# Patient Record
Sex: Male | Born: 1970 | Race: White | Hispanic: No | Marital: Married | Smoking: Never smoker
Health system: Southern US, Community
[De-identification: ages and names within clinical notes are randomized; demographics above are authoritative.]

## PROBLEM LIST (undated history)

## (undated) DIAGNOSIS — E785 Hyperlipidemia, unspecified: Secondary | ICD-10-CM

## (undated) DIAGNOSIS — Z9889 Other specified postprocedural states: Secondary | ICD-10-CM

## (undated) DIAGNOSIS — J189 Pneumonia, unspecified organism: Secondary | ICD-10-CM

## (undated) DIAGNOSIS — K219 Gastro-esophageal reflux disease without esophagitis: Secondary | ICD-10-CM

## (undated) DIAGNOSIS — Z87442 Personal history of urinary calculi: Secondary | ICD-10-CM

## (undated) DIAGNOSIS — R112 Nausea with vomiting, unspecified: Principal | ICD-10-CM

## (undated) DIAGNOSIS — I10 Essential (primary) hypertension: Secondary | ICD-10-CM

## (undated) DIAGNOSIS — R519 Headache, unspecified: Secondary | ICD-10-CM

## (undated) HISTORY — PX: HERNIA REPAIR: SHX51

## (undated) HISTORY — DX: Pneumonia, unspecified organism: J18.9

## (undated) HISTORY — DX: Hyperlipidemia, unspecified: E78.5

---

## 2011-11-12 ENCOUNTER — Ambulatory Visit (INDEPENDENT_AMBULATORY_CARE_PROVIDER_SITE_OTHER): Payer: 59 | Admitting: Urology

## 2011-11-12 DIAGNOSIS — Z3009 Encounter for other general counseling and advice on contraception: Secondary | ICD-10-CM

## 2011-12-31 ENCOUNTER — Encounter (INDEPENDENT_AMBULATORY_CARE_PROVIDER_SITE_OTHER): Payer: 59 | Admitting: Urology

## 2011-12-31 DIAGNOSIS — Z302 Encounter for sterilization: Secondary | ICD-10-CM

## 2012-01-14 ENCOUNTER — Ambulatory Visit (INDEPENDENT_AMBULATORY_CARE_PROVIDER_SITE_OTHER): Payer: 59 | Admitting: Urology

## 2012-01-14 DIAGNOSIS — Z09 Encounter for follow-up examination after completed treatment for conditions other than malignant neoplasm: Secondary | ICD-10-CM

## 2020-04-28 DIAGNOSIS — J189 Pneumonia, unspecified organism: Secondary | ICD-10-CM

## 2020-04-28 HISTORY — DX: Pneumonia, unspecified organism: J18.9

## 2020-07-24 ENCOUNTER — Ambulatory Visit (INDEPENDENT_AMBULATORY_CARE_PROVIDER_SITE_OTHER): Payer: BC Managed Care – PPO | Admitting: Internal Medicine

## 2020-07-24 ENCOUNTER — Other Ambulatory Visit: Payer: Self-pay

## 2020-07-24 ENCOUNTER — Ambulatory Visit (HOSPITAL_COMMUNITY)
Admission: RE | Admit: 2020-07-24 | Discharge: 2020-07-24 | Disposition: A | Discharge status: Home / Self Care | Payer: BC Managed Care – PPO | Source: Ambulatory Visit | Attending: Internal Medicine | Admitting: Internal Medicine

## 2020-07-24 ENCOUNTER — Encounter: Payer: Self-pay | Admitting: Internal Medicine

## 2020-07-24 DIAGNOSIS — J479 Bronchiectasis, uncomplicated: Secondary | ICD-10-CM | POA: Insufficient documentation

## 2020-07-24 DIAGNOSIS — J9611 Chronic respiratory failure with hypoxia: Secondary | ICD-10-CM | POA: Diagnosis not present

## 2020-07-24 DIAGNOSIS — R0609 Other forms of dyspnea: Secondary | ICD-10-CM

## 2020-07-24 DIAGNOSIS — R0600 Dyspnea, unspecified: Secondary | ICD-10-CM | POA: Diagnosis not present

## 2020-07-24 DIAGNOSIS — I10 Essential (primary) hypertension: Secondary | ICD-10-CM | POA: Insufficient documentation

## 2020-07-24 NOTE — Assessment & Plan Note (Signed)
Onset was Apr 28 2020 with covid  -  07/24/2020   Walked RA  approx   600 ft  @ fast pace  stopped due to  End of study s sob and sats 96%   At this point the main problem appears to be deconditioning > rec sub max ex x 30 min bid x 2 weeks then return for   RTW eval

## 2020-07-24 NOTE — Assessment & Plan Note (Signed)
Noted at pulmonary ov 07/24/2020   Not optimally controlled on present regimen. I reviewed this with the patient and emphasized importance of follow-up with primary care.

## 2020-07-24 NOTE — Progress Notes (Signed)
Raymond Watson, male    DOB: 08-31-1970  MRN: [id number]   Brief patient profile:  49 yo male never smoker/ never vaccinated  with h/o gerd and hyperlipidemia then acutely ill around Apr 28 2020   [facility] Internal Medicine Discharge Summary    Admit date: 04/29/2020 Discharge date: 05/10/2020 Length of stay: LOS: 11 days   Discharge Service: [facility] Internal Medicine Discharge Attending Physician: Kirstie Peri Discharge to: To Home Condition at Discharge: fair Code Status: Full Code  Patient Care Team: Hortencia Pilar, MD as PCP - General (Internal Medicine)  Consults   none  Discharge Diagnoses  Principal Problem: Pneumonia due to COVID-19 virus Active Problems: Hypoxia Diarrhea Hyperlipemia AKI (acute kidney injury) (CMS-HCC) Hiatal hernia Acute respiratory failure with hypoxia (CMS-HCC)    Hospital Course  Hello hello 49 year old gentleman 37 with history of hypertension comes with shortness of breath noted to have pneumonia associated with COVID-19 virus patient was admitted to hospital treated with antibiotics remdesivir and anticoagulation and followed closely management he slowly started to improve his oxygen level was better and lately at 4 L nasal cannula he was stable activity was increased and was discharged on medications noted below with a plan to follow him up in 1 week.  I spent greater than 30 mins in the discharge of this patient.  Procedures   none  Discharge Medications    Your Medication List   STOP taking these medications  azithromycin 250 MG tablet Commonly known as: ZITHROMAX Z-PAK  brompheniramine-pseudoephedrine-DM 2-30-10 mg/5 mL syrup Commonly known as: BROMFED DM   START taking these medications  albuterol 90 mcg/actuation inhaler Commonly known as: PROVENTIL HFA;VENTOLIN HFA Inhale 2 puffs every six (6) hours as needed for wheezing.   CONTINUE taking these medications  predniSONE 5 MG tablet Commonly known as: DELTASONE Take  6-5-4-3-2-1 po qd  rosuvastatin 5 MG tablet Commonly known as: CRESTOR Take 5 mg by mouth daily.    Pending Test Results   none  Lab Results   BLOOD No results in the last week Recent Labs  Lab Units 05/08/20 0412 05/07/20 0413  SODIUM mmol/L 135 136  POTASSIUM mmol/L 4.2 4.2  CHLORIDE mmol/L 101 103  CO2 mmol/L 29.3 28.2  BUN mg/dL 27* 29*  CREATININE mg/dL 6.27* 0.35*  GLUCOSE mg/dL 96 99  CALCIUM mg/dL 7.9* 8.2*   No results in the last week No results in the last week No results in the last week  No results in the last week URINE No results in the last week No results in the last week BODY FLUIDS No results in the last week ABG No results for input(s): O2SOUR, FIO2ART, PHART, PCO2ART, PO2ART, HCO3ART, O2SATART, BEART in the last 72 hours.  Microbiology Results (last day)  ** No results found for the last 24 hours. **    Imaging   ECG 12 Lead  Result Date: 04/30/2020 Normal sinus rhythm Abnormal QRS-T angle, consider primary T wave abnormality Abnormal ECG When compared with ECG of 28-Apr-2020 23:24, Vent. rate has decreased BY 58 BPM Confirmed by Gwenyth Bouillon ([id number]) on 04/30/2020 4:18:53 AM  XR Chest Portable  Result Date: 04/29/2020 CLINICAL DATA: Headache, body aches and cough which began Tuesday, COVID-19 positive EXAM: PORTABLE CHEST 1 VIEW COMPARISON: None. FINDINGS: Patchy heterogeneous opacities are present in both lungs with low volumes and streaky areas of more atelectatic change. No pneumothorax or visible effusion. There is a large air-filled hiatal hernia versus left diaphragmatic eventration. Remaining cardiomediastinal contours are unremarkable.  Gaseous distension of the colonic segments in the upper abdomen is nonspecific. No other acute or suspicious osseous or soft tissue abnormality. Mild degenerative changes in the shoulders.   1. Patchy heterogeneous opacities in both lungs consistent with pneumonia in the setting of COVID-19  positivity. 2. Large air-filled hiatal hernia versus left diaphragmatic eventration. 3. Air-filled loops of colon in the upper abdomen, nonspecific, correlate for abdominal symptoms. Electronically Signed By: Kreg ShropshirePrice DeHay M.D. On: 04/29/2020 02:09   CTA Chest W Contrast  Result Date: 04/30/2020 CLINICAL DATA: Shortness of breath. Concern for pulmonary embolism. COVID positive. EXAM: CT ANGIOGRAPHY CHEST WITH CONTRAST TECHNIQUE: Multidetector CT imaging of the chest was performed using the standard protocol during bolus administration of intravenous contrast. Multiplanar CT image reconstructions and MIPs were obtained to evaluate the vascular anatomy. CONTRAST: 100 cc of Isovue 370. COMPARISON: None. FINDINGS: Cardiovascular: Evaluation is limited by respiratory motion artifact. Given this significant limitation, no definite acute pulmonary embolism was detected. The heart size is unremarkable. There is no significant pericardial effusion. No evidence for thoracic aortic aneurysm or dissection. Mediastinum/Nodes: -- No mediastinal lymphadenopathy. -- No hilar lymphadenopathy. -- No axillary lymphadenopathy. -- No supraclavicular lymphadenopathy. -- Normal thyroid gland where visualized. -there is a large hiatal hernia with evidence for an organoaxial gastric volvulus. There is no definite evidence for obstruction. Lungs/Pleura: There are diffuse bilateral hazy airspace opacities consistent with the patient's history of viral pneumonia. There is significant compressive atelectasis involving the left lower lobe which is nearly entirely collapsed. There is a moderate amount of atelectasis within the lingula. There is no pneumothorax. The trachea is unremarkable. Upper Abdomen: Contrast bolus timing is not optimized for evaluation of the abdominal organs. The visualized portions of the organs of the upper abdomen are normal. Musculoskeletal: No chest wall abnormality. No bony spinal canal stenosis. Review of the MIP  images confirms the above findings. .   1. Evaluation is limited by respiratory motion artifact. Given this significant limitation, no definite acute pulmonary embolism was detected. 2. Diffuse bilateral hazy airspace opacities consistent with the patient's history of viral pneumonia. 3. Large hiatal hernia with the majority of the patient's stomach herniated into the thoracic cavity. The orientation of the stomach is consistent with an organoaxial gastric volvulus without evidence for obstruction. There is no definite evidence for obstruction. Electronically Signed By: Katherine Mantlehristopher Green M.D. On: 04/30/2020 20:09   Discharge Instructions   Resources and Referrals  Oxygen (DME)  Oxygen Type: Continuous  % saturation on room air at rest: 88  Provide Continous Oxygen at (LPM): 4  Oxygen device delivery method: Nasal Cannula  Type of portable O2 tank and concentrator to deliver: Gaseous  Other Orders: N/A  Length of Need: 99  Room Air Testing by pulse oximetry or ABG (Arterial Blood Gas): 88 % saturation rate on room air at rest. Please provide continuous oxygen at 4LPM via nasal cannula. Please deliver portable O2 tanks and concentrator to patient.   Room Air Testing by pulse oximetry or ABG (Arterial Blood Gas): 88 % saturation rate on room air at rest. Please provide continuous oxygen at 4LPM via nasal cannula. Please deliver portable O2 tanks and concentrator to patient.         History of Present Illness  07/24/2020  Pulmonary/ 1st office eval/ Jaylianna Tatlock / [place] [facility]  Chief Complaint  Patient presents with   Consult    No complaints currently, currently on 2L O2  Dyspnea:  Mailbox is downhill from house and can back up  hill to house s stopping at slower than usual pace but sats so far above 90% s  02  Cough: much better  Sleep: sleeps on pillows at about 30 degrees  SABA use: symbicort 160 2bid / twice albuterol  02 2lpm concentrator at hs and 24/7.  No obvious day to day  or daytime variability or assoc excess/ purulent sputum or mucus plugs or hemoptysis or cp or chest tightness, subjective wheeze or overt sinus or hb symptoms (as long as takes dexilant)   Sleeping as above without nocturnal  or early am exacerbation  of respiratory  c/o's or need for noct saba. Also denies any obvious fluctuation of symptoms with weather or environmental changes or other aggravating or alleviating factors except as outlined above   No unusual exposure hx or h/o childhood pna/ asthma or knowledge of premature birth.  Current Allergies, Complete Past Medical History, Past Surgical History, Family History, and Social History were reviewed in [employer] record.  ROS  The following are not active complaints unless bolded Hoarseness, sore throat, dysphagia, dental problems, itching, sneezing,  nasal congestion or discharge of excess mucus or purulent secretions, ear ache,   fever, chills, sweats, unintended wt loss or wt gain, classically pleuritic or exertional cp,  orthopnea pnd or arm/hand swelling  or leg swelling, presyncope, palpitations, abdominal pain, anorexia, nausea, vomiting, diarrhea  or change in bowel habits or change in bladder habits, change in stools or change in urine, dysuria, hematuria,  rash, arthralgias, visual complaints, headache, numbness, weakness or ataxia or problems with walking or coordination,  change in mood or  memory.           Past Medical History:  Diagnosis Date   Hyperlipidemia    Pneumonia     Outpatient Medications Prior to Visit  Medication Sig Dispense Refill   albuterol (VENTOLIN HFA) 108 (90 Base) MCG/ACT inhaler Inhale 2 puffs into the lungs every 6 (six) hours.     dexlansoprazole (DEXILANT) 60 MG capsule Take 60 mg by mouth daily.     rosuvastatin (CRESTOR) 5 MG tablet Take 5 mg by mouth daily.     SYMBICORT 160-4.5 MCG/ACT inhaler Inhale 2 puffs into the lungs in the morning and at bedtime.     No  facility-administered medications prior to visit.     Objective:     BP (!) 154/98 (BP Location: Left Arm, Cuff Size: Normal)    Pulse 88    Temp (!) 97.2 F (36.2 C) (Other (Comment)) Comment (Src): wrist   Ht 5\' 5"  (1.651 m)    Wt 171 lb 6.4 oz (77.7 kg)    SpO2 100% Comment: 2L O2   BMI 28.52 kg/m   SpO2: 100 % (2L O2) O2 Type: Continuous O2 O2 Flow Rate (L/min): 2 L/min   Sats RA rest =    HEENT : pt wearing mask not removed for exam due to covid -19 concerns.    NECK :  without JVD/Nodes/TM/ nl carotid upstrokes bilaterally   LUNGS: no acc muscle use,  Nl contour chest which is clear to A and P bilaterally without cough on insp or exp maneuvers   CV:  RRR  no s3 or murmur or increase in P2, and no edema   ABD:  soft and nontender with nl inspiratory excursion in the supine position. No bruits or organomegaly appreciated, bowel sounds nl  MS:  Nl gait/ ext warm without deformities, calf tenderness, cyanosis or clubbing No obvious joint  restrictions   SKIN: warm and dry without lesions    NEURO:  alert, approp, nl sensorium with  no motor or cerebellar deficits apparent.    CXR PA and Lateral:   07/24/2020 :    I personally reviewed images and agree with radiology impression as follows:    1. Improving appearance of the chest without signs of consolidation or effusion.     2. Large hiatal hernia.   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    Chronic respiratory failure with hypoxia [facility]) Onset Oct 2021 with covid -   07/24/2020   Walked RA  approx   600 ft  @ fast pace  stopped due to  End of study s sob and sats 96%   Advised just to use at hs for now and prn daytime  = Make sure you check your oxygen saturation  at your highest level of activity  to be sure it stays over 90% and adjust  02 flow upward to maintain this level if needed but remember to turn it back to previous settings when you stop (to conserve your supply).     White coat syndrome with hypertension Noted at pulmonary ov 07/24/2020   Not optimally controlled on present regimen. I reviewed this with the patient and emphasized importance of follow-up with primary care.             Each maintenance medication was reviewed in detail including emphasizing most importantly the difference between maintenance and prns and under what circumstances the prns are to be triggered using an action plan format where appropriate.  Total time for H and P, chart review, counseling, teaching device  directly observing portions of ambulatory 02 saturation study/  and generating customized AVS unique to this office visit / charting = 52 min        Sandrea Hughs, MD 07/24/2020

## 2020-07-24 NOTE — Assessment & Plan Note (Signed)
CT chest 06/09/20 p covid changes with tubular bronchiectasis both lower lobes -  Repeat CT = HRCT rec for 12/07/20 (placed in reminder file )  - 07/24/2020   Demonstrated approp hfa > use symbicort 160 1-2 bid and approp saba   Does not have the typical bronchiectatic cough at this point but may benefit from symb low dose = 160 1-2 bid (one bid if doing well, then change to 80 next ov p confirm adequate hfa technique.

## 2020-07-24 NOTE — Assessment & Plan Note (Addendum)
Onset Oct 2021 with covid -   07/24/2020   Walked RA  approx   600 ft  @ fast pace  stopped due to  End of study s sob and sats 96%   Advised just to use at hs for now and prn daytime  = Make sure you check your oxygen saturation  at your highest level of activity  to be sure it stays over 90% and adjust  02 flow upward to maintain this level if needed but remember to turn it back to previous settings when you stop (to conserve your supply).           Each maintenance medication was reviewed in detail including emphasizing most importantly the difference between maintenance and prns and under what circumstances the prns are to be triggered using an action plan format where appropriate.  Total time for H and P, chart review, counseling, teaching device  directly observing portions of ambulatory 02 saturation study/  and generating customized AVS unique to this office visit / charting = 52 min

## 2020-07-24 NOTE — Patient Instructions (Addendum)
Plan A = Automatic = Always=    Symbicort 160 Take 1- 2 puffs first thing in am and then another 1-2 puffs about 12 hours later.   Work on inhaler technique:  relax and gently blow all the way out then take a nice smooth deep breath back in, triggering the inhaler at same time you start breathing in.  Hold for up to 5 seconds if you can. Blow out thru nose. Rinse and gargle with water when done    Plan B = Backup (to supplement plan A, not to replace it) Only use your albuterol inhaler as a rescue medication to be used if you can't catch your breath by resting or doing a relaxed purse lip breathing pattern.  - The less you use it, the better it will work when you need it. - Ok to use the inhaler up to 2 puffs  every 4 hours if you must but call for appointment if use goes up over your usual need - Don't leave home without it !!  (think of it like the spare tire for your car)    Ok to try albuterol (on alternate days) 15 min before an activity that you know would make you short of breath and see if it makes any difference and if makes none then don't take it after activity unless you can't catch your breath.   Make sure you check your oxygen saturation  at your highest level of activity  to be sure it stays over 90% and adjust  02 flow upward to maintain this level if needed but remember to turn it back to previous settings when you stop (to conserve your supply).    Continue 2lpm at bedtime and the rest of the day only if needed     To get the most out of exercise, you need to be continuously aware that you are short of breath, but never out of breath, for 30 minutes twice daily. As you improve, it will actually be easier for you to do the same amount of exercise  in  30 minutes so always push to the level where you are short of breath.     Please remember to go to the  x-ray department  @  [facility] for your tests - we will call you with the results when they are available       Please schedule a follow up office visit in 2  weeks, sooner if needed

## 2020-08-01 ENCOUNTER — Telehealth: Payer: Self-pay | Admitting: Internal Medicine

## 2020-08-01 NOTE — Telephone Encounter (Signed)
Routing to [place] for follow-up

## 2020-08-02 DIAGNOSIS — J479 Bronchiectasis, uncomplicated: Secondary | ICD-10-CM

## 2020-08-02 NOTE — Telephone Encounter (Signed)
Forms will be given to Dr. Sherene Sires tomorrow 1/6 - as he is gone for today - sent email to Marisue Ivan to drop charge. -pr

## 2020-08-07 NOTE — Telephone Encounter (Signed)
Faxed forms to [place] at [phone]

## 2020-08-07 NOTE — Telephone Encounter (Signed)
Patient will pay $29 when he comes in tomorrow for office visit -pr

## 2020-08-08 ENCOUNTER — Other Ambulatory Visit: Payer: Self-pay

## 2020-08-08 ENCOUNTER — Encounter: Payer: Self-pay | Admitting: *Deleted

## 2020-08-08 ENCOUNTER — Ambulatory Visit (INDEPENDENT_AMBULATORY_CARE_PROVIDER_SITE_OTHER): Payer: BC Managed Care – PPO | Admitting: Internal Medicine

## 2020-08-08 ENCOUNTER — Other Ambulatory Visit (HOSPITAL_COMMUNITY)
Admission: RE | Admit: 2020-08-08 | Discharge: 2020-08-08 | Disposition: A | Discharge status: Home / Self Care | Payer: BC Managed Care – PPO | Source: Ambulatory Visit | Attending: Internal Medicine | Admitting: Internal Medicine

## 2020-08-08 ENCOUNTER — Encounter: Payer: Self-pay | Admitting: Internal Medicine

## 2020-08-08 DIAGNOSIS — R0600 Dyspnea, unspecified: Secondary | ICD-10-CM | POA: Diagnosis not present

## 2020-08-08 DIAGNOSIS — R0609 Other forms of dyspnea: Secondary | ICD-10-CM

## 2020-08-08 DIAGNOSIS — J479 Bronchiectasis, uncomplicated: Secondary | ICD-10-CM

## 2020-08-08 DIAGNOSIS — J9611 Chronic respiratory failure with hypoxia: Secondary | ICD-10-CM | POA: Diagnosis not present

## 2020-08-08 LAB — CBC WITH DIFFERENTIAL/PLATELET
Abs Immature Granulocytes: 0.01 10*3/uL (ref 0.00–0.07)
Basophils Absolute: 0 10*3/uL (ref 0.0–0.1)
Basophils Relative: 1 %
Eosinophils Absolute: 0.1 10*3/uL (ref 0.0–0.5)
Eosinophils Relative: 1 %
HCT: 44.8 % (ref 39.0–52.0)
Hemoglobin: 13.7 g/dL (ref 13.0–17.0)
Immature Granulocytes: 0 %
Lymphocytes Relative: 31 %
Lymphs Abs: 2.4 10*3/uL (ref 0.7–4.0)
MCH: 27.1 pg (ref 26.0–34.0)
MCHC: 30.6 g/dL (ref 30.0–36.0)
MCV: 88.5 fL (ref 80.0–100.0)
Monocytes Absolute: 0.6 10*3/uL (ref 0.1–1.0)
Monocytes Relative: 8 %
Neutro Abs: 4.6 10*3/uL (ref 1.7–7.7)
Neutrophils Relative %: 59 %
Platelets: 233 10*3/uL (ref 150–400)
RBC: 5.06 MIL/uL (ref 4.22–5.81)
RDW: 15.2 % (ref 11.5–15.5)
WBC: 7.7 10*3/uL (ref 4.0–10.5)
nRBC: 0 % (ref 0.0–0.2)

## 2020-08-08 LAB — BASIC METABOLIC PANEL
Anion gap: 6 (ref 5–15)
BUN: 18 mg/dL (ref 6–20)
CO2: 28 mmol/L (ref 22–32)
Calcium: 8.8 mg/dL — ABNORMAL LOW (ref 8.9–10.3)
Chloride: 105 mmol/L (ref 98–111)
Creatinine, Ser: 1.38 mg/dL — ABNORMAL HIGH (ref 0.61–1.24)
GFR, Estimated: >60 mL/min (ref >60)
Glucose, Bld: 112 mg/dL — ABNORMAL HIGH (ref 70–99)
Potassium: 4.3 mmol/L (ref 3.5–5.1)
Sodium: 139 mmol/L (ref 135–145)

## 2020-08-08 LAB — BRAIN NATRIURETIC PEPTIDE: B Natriuretic Peptide: 15 pg/mL (ref 0.0–100.0)

## 2020-08-08 LAB — SEDIMENTATION RATE: Sed Rate: 8 mm/hr (ref 0–16)

## 2020-08-08 LAB — D-DIMER, QUANTITATIVE: D-Dimer, Quant: <0.27 ug/mL-FEU (ref 0.00–0.50)

## 2020-08-08 NOTE — Assessment & Plan Note (Signed)
Onset was Apr 28 2020 with covid  -  07/24/2020   Walked RA  approx   600 ft  @ fast pace  stopped due to  End of study s sob and sats 96%  Symptoms are   disproportionate to objective findings and not clear to what extent this is   a pulmonary  problem but pt does appear to have difficult to sort out respiratory symptoms of unknown origin for which  DDX  = almost all start with A and  include Adherence, Ace Inhibitors, Acid Reflux, Active Sinus Disease, Alpha 1 Antitripsin deficiency, Anxiety masquerading as Airways dz,  ABPA,  Allergy(esp in young), Aspiration (esp in elderly), Adverse effects of meds,  Active smoking or Vaping, A bunch of PE's/clot burden (a few small clots can't cause this syndrome unless there is already severe underlying pulm or vascular dz with poor reserve),  Anemia or thyroid disorder, plus two Bs  = Bronchiectasis and Beta blocker use..and one C= CHF     Adherence is always the initial "prime suspect" and is a multilayered concern that requires a "trust but verify" approach in every patient - starting with knowing how to use medications, especially inhalers, correctly, keeping up with refills and understanding the fundamental difference between maintenance and prns vs those medications only taken for a very short course and then stopped and not refilled.  - see hfa teaching  ? Acid (or non-acid) GERD > always difficult to exclude as up to 75% of pts in some series report no assoc GI/ Heartburn symptoms and he has a large HH with atypical cp > rec max (24h)  acid suppression and diet restrictions/ reviewed and instructions given in writing.   ? Allergy / asthma > eos nl/ Continue symbicort 160 2bid   ? Adverse drug effects > none of the usual suspects listed  ? Anemia/thyroid dz > hgb ok/ note tsh ok at Dr Margaretmary Eddy office in Nov 2021  ? A bunch of PEs  > D dimer nl - while a normal   value (seen commonly in the elderly or chronically ill)  may miss small peripheral pe, the  clot burden with sob is moderately high and the d dimer  has a very high neg pred value if used in this setting.    ? Bronchiectasis > needs pfts ordered then ?  Return to work ?    ? chf > bnp nl excludes    >>> reconditioning reviewed - see avs for instructions unique to this ov

## 2020-08-08 NOTE — Patient Instructions (Addendum)
Work on inhaler technique:  relax and gently blow all the way out then take a nice smooth deep breath back in, triggering the inhaler at same time you start breathing in.  Hold for up to 5 seconds if you can. Blow out thru nose. Rinse and gargle with water when done   To get the most out of exercise, you need to be continuously aware that you are short of breath, but never out of breath, for 30 minutes twice daily. As you improve, it will actually be easier for you to do the same amount of exercise  in  30 minutes so always push to the level where you are short of breath.      Make sure you check your oxygen saturation at your highest level of activity to be sure it stays over 90% and keep track of it at least once a week, more often if breathing getting worse, and let me know if losing ground.     I very strongly recommend you get the moderna or pfizer vaccine as soon as possible based on your risk of dying from the virus  and the proven safety and benefit of these vaccines against even the delta and omicron variants.  This can save your life as well as  those of your loved ones,  especially if they are also not vaccinated.     GERD (REFLUX)  is an extremely common cause of respiratory symptoms just like yours , many times with no obvious heartburn at all.    It can be treated with medication, but also with lifestyle changes including elevation of the head of your bed (ideally with 6 -8inch blocks under the headboard of your bed),  Smoking cessation, avoidance of late meals, excessive alcohol, and avoid fatty foods, chocolate, peppermint, colas, red wine, and acidic juices such as orange juice.  NO MINT OR MENTHOL PRODUCTS SO NO COUGH DROPS  USE SUGARLESS CANDY INSTEAD (Jolley ranchers or Stover's or Life Savers) or even ice chips will also do - the key is to swallow to prevent all throat clearing. NO OIL BASED VITAMINS - use powdered substitutes.  Avoid fish oil when coughing.   Please remember  to go to the lab department @ [facility] for your tests - we will call you with the results when they are available.      Please schedule a follow up office visit in 4 weeks, call sooner if needed -  We will re-evaluate your ability to work then   PFT's on return

## 2020-08-08 NOTE — Assessment & Plan Note (Signed)
Onset Oct 2021 with covid -   07/24/2020   Walked RA  approx   600 ft  @ fast pace  stopped due to  End of study s sob and sats 96%  - as of 08/08/2020  Just using 02 2lpm hs   >>> Likely no longer needs hs but would need ONO RA to clear for d/c noct 02.

## 2020-08-08 NOTE — Progress Notes (Signed)
Raymond Watson, male    DOB: 07/14/1971  MRN: [id number]   Brief patient profile:  50 yo male never smoker/ never vaccinated  with h/o gerd and hyperlipidemia then acutely ill around Apr 28 2020   [facility] Internal Medicine Discharge Summary    Admit date: 04/29/2020 Discharge date: 05/10/2020 Length of stay: LOS: 11 days   Discharge Service: [facility] Internal Medicine Discharge Attending Physician: Kirstie Peri Discharge to: To Home Condition at Discharge: fair Code Status: Full Code  Patient Care Team: Hortencia Pilar, MD as PCP - General (Internal Medicine)  Consults   none  Discharge Diagnoses  Principal Problem: Pneumonia due to COVID-19 virus Active Problems: Hypoxia Diarrhea Hyperlipemia AKI (acute kidney injury) (CMS-HCC) Hiatal hernia Acute respiratory failure with hypoxia (CMS-HCC)    Hospital Course  Hello hello 50 year old gentleman 44 with history of hypertension comes with shortness of breath noted to have pneumonia associated with COVID-19 virus patient was admitted to hospital treated with antibiotics remdesivir and anticoagulation and followed closely management he slowly started to improve his oxygen level was better and lately at 4 L nasal cannula he was stable activity was increased and was discharged on medications noted below with a plan to follow him up in 1 week.  I spent greater than 30 mins in the discharge of this patient.  Procedures   none  Discharge Medications    Your Medication List   STOP taking these medications  azithromycin 250 MG tablet Commonly known as: ZITHROMAX Z-PAK  brompheniramine-pseudoephedrine-DM 2-30-10 mg/5 mL syrup Commonly known as: BROMFED DM   START taking these medications  albuterol 90 mcg/actuation inhaler Commonly known as: PROVENTIL HFA;VENTOLIN HFA Inhale 2 puffs every six (6) hours as needed for wheezing.   CONTINUE taking these medications  predniSONE 5 MG tablet Commonly known as: DELTASONE Take  6-5-4-3-2-1 po qd  rosuvastatin 5 MG tablet Commonly known as: CRESTOR Take 5 mg by mouth daily.    Pending Test Results   none  Lab Results   BLOOD No results in the last week Recent Labs  Lab Units 05/08/20 0412 05/07/20 0413  SODIUM mmol/L 135 136  POTASSIUM mmol/L 4.2 4.2  CHLORIDE mmol/L 101 103  CO2 mmol/L 29.3 28.2  BUN mg/dL 27* 29*  CREATININE mg/dL 8.84* 1.66*  GLUCOSE mg/dL 96 99  CALCIUM mg/dL 7.9* 8.2*   No results in the last week No results in the last week No results in the last week  No results in the last week URINE No results in the last week No results in the last week BODY FLUIDS No results in the last week ABG No results for input(s): O2SOUR, FIO2ART, PHART, PCO2ART, PO2ART, HCO3ART, O2SATART, BEART in the last 72 hours.  Microbiology Results (last day)  ** No results found for the last 24 hours. **    Imaging   ECG 12 Lead  Result Date: 04/30/2020 Normal sinus rhythm Abnormal QRS-T angle, consider primary T wave abnormality Abnormal ECG When compared with ECG of 28-Apr-2020 23:24, Vent. rate has decreased BY 58 BPM Confirmed by Gwenyth Bouillon ([id number]) on 04/30/2020 4:18:53 AM  XR Chest Portable  Result Date: 04/29/2020 CLINICAL DATA: Headache, body aches and cough which began Tuesday, COVID-19 positive EXAM: PORTABLE CHEST 1 VIEW COMPARISON: None. FINDINGS: Patchy heterogeneous opacities are present in both lungs with low volumes and streaky areas of more atelectatic change. No pneumothorax or visible effusion. There is a large air-filled hiatal hernia versus left diaphragmatic eventration. Remaining cardiomediastinal contours are unremarkable.  Gaseous distension of the colonic segments in the upper abdomen is nonspecific. No other acute or suspicious osseous or soft tissue abnormality. Mild degenerative changes in the shoulders.   1. Patchy heterogeneous opacities in both lungs consistent with pneumonia in the setting of COVID-19  positivity. 2. Large air-filled hiatal hernia versus left diaphragmatic eventration. 3. Air-filled loops of colon in the upper abdomen, nonspecific, correlate for abdominal symptoms. Electronically Signed By: Kreg Shropshire M.D. On: 04/29/2020 02:09   CTA Chest W Contrast  Result Date: 04/30/2020 CLINICAL DATA: Shortness of breath. Concern for pulmonary embolism. COVID positive. EXAM: CT ANGIOGRAPHY CHEST WITH CONTRAST TECHNIQUE: Multidetector CT imaging of the chest was performed using the standard protocol during bolus administration of intravenous contrast. Multiplanar CT image reconstructions and MIPs were obtained to evaluate the vascular anatomy. CONTRAST: 100 cc of Isovue 370. COMPARISON: None. FINDINGS: Cardiovascular: Evaluation is limited by respiratory motion artifact. Given this significant limitation, no definite acute pulmonary embolism was detected. The heart size is unremarkable. There is no significant pericardial effusion. No evidence for thoracic aortic aneurysm or dissection. Mediastinum/Nodes: -- No mediastinal lymphadenopathy. -- No hilar lymphadenopathy. -- No axillary lymphadenopathy. -- No supraclavicular lymphadenopathy. -- Normal thyroid gland where visualized. -there is a large hiatal hernia with evidence for an organoaxial gastric volvulus. There is no definite evidence for obstruction. Lungs/Pleura: There are diffuse bilateral hazy airspace opacities consistent with the patient's history of viral pneumonia. There is significant compressive atelectasis involving the left lower lobe which is nearly entirely collapsed. There is a moderate amount of atelectasis within the lingula. There is no pneumothorax. The trachea is unremarkable. Upper Abdomen: Contrast bolus timing is not optimized for evaluation of the abdominal organs. The visualized portions of the organs of the upper abdomen are normal. Musculoskeletal: No chest wall abnormality. No bony spinal canal stenosis. Review of the MIP  images confirms the above findings. .   1. Evaluation is limited by respiratory motion artifact. Given this significant limitation, no definite acute pulmonary embolism was detected. 2. Diffuse bilateral hazy airspace opacities consistent with the patient's history of viral pneumonia. 3. Large hiatal hernia with the majority of the patient's stomach herniated into the thoracic cavity. The orientation of the stomach is consistent with an organoaxial gastric volvulus without evidence for obstruction. There is no definite evidence for obstruction. Electronically Signed By: Katherine Mantle M.D. On: 04/30/2020 20:09   Discharge Instructions   Resources and Referrals  Oxygen (DME)  Oxygen Type: Continuous  % saturation on room air at rest: 88  Provide Continous Oxygen at (LPM): 4  Oxygen device delivery method: Nasal Cannula  Type of portable O2 tank and concentrator to deliver: Gaseous  Other Orders: N/A  Length of Need: 99  Room Air Testing by pulse oximetry or ABG (Arterial Blood Gas): 88 % saturation rate on room air at rest. Please provide continuous oxygen at 4LPM via nasal cannula. Please deliver portable O2 tanks and concentrator to patient.   Room Air Testing by pulse oximetry or ABG (Arterial Blood Gas): 88 % saturation rate on room air at rest. Please provide continuous oxygen at 4LPM via nasal cannula. Please deliver portable O2 tanks and concentrator to patient.         History of Present Illness  07/24/2020  Pulmonary/ 1st office eval/ Raymond Watson / [facility] Office  Chief Complaint  Patient presents with  . Consult    No complaints currently, currently on 2L O2  Dyspnea:  Mailbox is downhill from house and can back up  hill to house s stopping at slower than usual pace but sats so far above 90% s  02  Cough: much better  Sleep: sleeps on pillows at about 30 degrees  SABA use: symbicort 160 2bid / twice albuterol  02 2lpm concentrator at hs and 24/7. rx Plan A = Automatic =  Always=    Symbicort 160 Take 1- 2 puffs first thing in am and then another 1-2 puffs about 12 hours later.  Work on inhaler technique:   Plan B = Backup (to supplement plan A, not to replace it) Only use your albuterol inhaler as a rescue medication  Ok to try albuterol (on alternate days) 15 min before an activity that you know would make you short of breath and see if it makes any difference and if makes none then don't take it after activity unless you can't catch your breath. Make sure you check your oxygen saturation  at your highest level of activity  to be sure it stays over 90% and adjust  02 flow upward to maintain this level if needed but remember to turn it back to previous settings when you stop (to conserve your supply).  Continue 2lpm at bedtime and the rest of the day only if needed    08/08/2020  f/u ov/[place] office/Raymond Watson re: post covid doe / bronchiectasis on ct on symbicort 160 2 in am / 1 in pm Chief Complaint  Patient presents with  . Follow-up    Breathing is unchanged since the last visit. He has only used his albuterol x 1 since his last visit 07/24/20. He states using o2 with sleep but not during the day since sats have stayed above 90% ra.   Dyspnea:  Mailbox and back better sats 94% s cp on exr  Cough: better / min mucoid Sleeping: flat bed, bunch of pillows SABA use: rarely 02: 2lph hs  Chest discomfort L of sternum / never noct  / never pleuritic or ex  Can't do steps due to sob and job in [employer]  requires lifting/ steps    No obvious day to day or daytime variability or assoc excess/ purulent sputum or mucus plugs or hemoptysis or chest tightness, subjective wheeze or overt sinus or hb symptoms.   Sleeping as above without nocturnal  or early am exacerbation  of respiratory  c/o's or need for noct saba. Also denies any obvious fluctuation of symptoms with weather or environmental changes or other aggravating or alleviating factors except as outlined above    No unusual exposure hx or h/o childhood pna/ asthma or knowledge of premature birth.  Current Allergies, Complete Past Medical History, Past Surgical History, Family History, and Social History were reviewed in [employer] record.  ROS  The following are not active complaints unless bolded Hoarseness, sore throat, dysphagia, dental problems, itching, sneezing,  nasal congestion or discharge of excess mucus or purulent secretions, ear ache,   fever, chills, sweats, unintended wt loss or wt gain, classically pleuritic or exertional cp,  orthopnea pnd or arm/hand swelling  or leg swelling, presyncope, palpitations, abdominal pain, anorexia, nausea, vomiting, diarrhea  or change in bowel habits or change in bladder habits, change in stools or change in urine, dysuria, hematuria,  rash, arthralgias, visual complaints, headache, numbness, weakness or ataxia or problems with walking or coordination,  change in mood or  memory.        Current Meds  Medication Sig  . albuterol (VENTOLIN HFA) 108 (90 Base) MCG/ACT  inhaler Inhale 2 puffs into the lungs every 6 (six) hours.  Marland Kitchen dexlansoprazole (DEXILANT) 60 MG capsule Take 60 mg by mouth daily.  . rosuvastatin (CRESTOR) 5 MG tablet Take 5 mg by mouth daily.  . SYMBICORT 160-4.5 MCG/ACT inhaler Inhale 2 puffs into the lungs in the morning and at bedtime.                 Past Medical History:  Diagnosis Date  . Hyperlipidemia   . Pneumonia        Objective:     Wt Readings from Last 3 Encounters:  08/08/20 174 lb (78.9 kg)  07/24/20 171 lb 6.4 oz (77.7 kg)      Vital signs reviewed  08/08/2020  - Note at rest 02 sats  100% on RA   General appearance:    amb male nad      HEENT : pt wearing mask not removed for exam due to covid -19 concerns.    NECK :  without JVD/Nodes/TM/ nl carotid upstrokes bilaterally   LUNGS: no acc muscle use,  Nl contour chest which is clear to A and P bilaterally without cough on  insp or exp maneuvers   CV:  RRR  no s3 or murmur or increase in P2, and no edema   ABD: mod obese soft and nontender with nl inspiratory excursion in the supine position. No bruits or organomegaly appreciated, bowel sounds nl  MS:  Nl gait/ ext warm without deformities, calf tenderness, cyanosis or clubbing No obvious joint restrictions   SKIN: warm and dry without lesions    NEURO:  alert, approp, nl sensorium with  no motor or cerebellar deficits apparent.     Labs ordered/ reviewed:      Chemistry      Component Value Date/Time   NA 139 08/08/2020 1143   K 4.3 08/08/2020 1143   CL 105 08/08/2020 1143   CO2 28 08/08/2020 1143   BUN 18 08/08/2020 1143   CREATININE 1.38 (H) 08/08/2020 1143      Component Value Date/Time   CALCIUM 8.8 (L) 08/08/2020 1143        Lab Results  Component Value Date   WBC 7.7 08/08/2020   HGB 13.7 08/08/2020   HCT 44.8 08/08/2020   MCV 88.5 08/08/2020   PLT 233 08/08/2020       EOS                                                               0.1                                     08/08/2020   Lab Results  Component Value Date   DDIMER <0.27 08/08/2020    BNP          08/08/2020       15    Lab Results  Component Value Date   ESRSEDRATE 8 08/08/2020             Assessment

## 2020-08-08 NOTE — Assessment & Plan Note (Addendum)
CT chest 06/09/20 p covid (dx 04/29/20)  changes with tubular bronchiectasis both lower lobes -  Repeat CT = HRCT rec for 12/07/20 (placed in reminder file )  - 07/24/2020   Demonstrated approp hfa > use symbicort 160 1-2 bid and approp saba  - 08/08/2020  After extensive coaching inhaler device,  effectiveness =    75% from baseline 25%  - PFT's ordered  I cannot detect any wheeze / rhonchi and cough is not an issue so I can't explain the doe at present - see sep   For now continue max symbicort = 160 2bid and f/u in 4 weeks with pfts to see if /why can't return to work in [employer]          Each maintenance medication was reviewed in detail including emphasizing most importantly the difference between maintenance and prns and under what circumstances the prns are to be triggered using an action plan format where appropriate.  Total time for H and P, chart review, counseling, reviewing hfa  device(s) and generating customized AVS unique to this office visit / same day charting = 45 min

## 2020-08-09 ENCOUNTER — Encounter: Payer: Self-pay | Admitting: *Deleted

## 2020-08-10 NOTE — Telephone Encounter (Signed)
Per Archie Patten - pt will stop by next week at the [place] office to pay fee -pr

## 2020-08-10 NOTE — Telephone Encounter (Signed)
Unable to collect $29 at office visit in [place] due to no front staff member available. Sent message to Archie Patten to reach out to patient to see if he will be willing to stop by the office to pay fee -pr

## 2020-08-18 NOTE — Telephone Encounter (Signed)
Please advise if this has been taken care of.  °

## 2020-08-24 NOTE — Telephone Encounter (Signed)
Spoke with patient he said he will be out this way 1/28 and he will come by to pay the 29.00 fee. Nothing further needed.

## 2020-09-01 NOTE — Telephone Encounter (Signed)
Fee paid - copy mailed to patient -pr

## 2020-09-07 ENCOUNTER — Telehealth: Payer: Self-pay | Admitting: Internal Medicine

## 2020-09-07 DIAGNOSIS — R0600 Dyspnea, unspecified: Secondary | ICD-10-CM

## 2020-09-07 DIAGNOSIS — R0609 Other forms of dyspnea: Secondary | ICD-10-CM

## 2020-09-08 NOTE — Telephone Encounter (Signed)
Called and scheduled office visit with Dr Sherene Sires for Thursday 09/21/2020 at 10:15am. Placed PFT order per Dr Sherene Sires discharge summary note. Will route back to [facility] to get scheduled at [facility] to be done prior to 09/21/2020 if possible.

## 2020-09-08 NOTE — Telephone Encounter (Signed)
There is not an order for PFT.  Will route to [place] to have order placed and to have appt scheduled with MW in [place].

## 2020-09-08 NOTE — Telephone Encounter (Signed)
Spoke to [place] and she called [facility] & asked them to schedule pt prior to appt with MW.  Nothing further needed.

## 2020-09-15 ENCOUNTER — Other Ambulatory Visit: Payer: Self-pay

## 2020-09-15 ENCOUNTER — Other Ambulatory Visit (HOSPITAL_COMMUNITY)
Admission: RE | Admit: 2020-09-15 | Discharge: 2020-09-15 | Disposition: A | Discharge status: Home / Self Care | Payer: BC Managed Care – PPO | Source: Ambulatory Visit | Attending: Internal Medicine | Admitting: Internal Medicine

## 2020-09-15 DIAGNOSIS — Z20822 Contact with and (suspected) exposure to covid-19: Secondary | ICD-10-CM | POA: Diagnosis not present

## 2020-09-15 DIAGNOSIS — Z01812 Encounter for preprocedural laboratory examination: Secondary | ICD-10-CM | POA: Insufficient documentation

## 2020-09-15 LAB — SARS CORONAVIRUS 2 (TAT 6-24 HRS): SARS Coronavirus 2: NEGATIVE

## 2020-09-19 ENCOUNTER — Ambulatory Visit (HOSPITAL_COMMUNITY)
Admission: RE | Admit: 2020-09-19 | Discharge: 2020-09-19 | Disposition: A | Discharge status: Home / Self Care | Payer: BC Managed Care – PPO | Source: Ambulatory Visit | Attending: Internal Medicine | Admitting: Internal Medicine

## 2020-09-19 ENCOUNTER — Other Ambulatory Visit: Payer: Self-pay

## 2020-09-19 DIAGNOSIS — R0609 Other forms of dyspnea: Secondary | ICD-10-CM

## 2020-09-19 DIAGNOSIS — R0600 Dyspnea, unspecified: Secondary | ICD-10-CM | POA: Diagnosis not present

## 2020-09-19 LAB — PULMONARY FUNCTION TEST
DL/VA % pred: 106 %
DL/VA: 4.82 ml/min/mmHg/L
DLCO unc % pred: 72 %
DLCO unc: 18.09 ml/min/mmHg
FEF 25-75 Post: 4.13 L/sec
FEF 25-75 Pre: 3.8 L/sec
FEF2575-%Change-Post: 8 %
FEF2575-%Pred-Post: 138 %
FEF2575-%Pred-Pre: 127 %
FEV1-%Change-Post: 0 %
FEV1-%Pred-Post: 69 %
FEV1-%Pred-Pre: 69 %
FEV1-Post: 2.27 L
FEV1-Pre: 2.27 L
FEV1FVC-%Change-Post: -2 %
FEV1FVC-%Pred-Pre: 117 %
FEV6-%Change-Post: 2 %
FEV6-%Pred-Post: 62 %
FEV6-%Pred-Pre: 61 %
FEV6-Post: 2.55 L
FEV6-Pre: 2.49 L
FEV6FVC-%Pred-Post: 104 %
FEV6FVC-%Pred-Pre: 104 %
FVC-%Change-Post: 2 %
FVC-%Pred-Post: 60 %
FVC-%Pred-Pre: 58 %
FVC-Post: 2.55 L
FVC-Pre: 2.49 L
Post FEV1/FVC ratio: 89 %
Post FEV6/FVC ratio: 100 %
Pre FEV1/FVC ratio: 91 %
Pre FEV6/FVC Ratio: 100 %
RV % pred: 44 %
RV: 0.8 L
TLC % pred: 58 %
TLC: 3.51 L

## 2020-09-19 MED ORDER — ALBUTEROL SULFATE (2.5 MG/3ML) 0.083% IN NEBU
2.5000 mg | INHALATION_SOLUTION | Freq: Once | RESPIRATORY_TRACT | Status: AC
  Administered 2020-09-19: 2.5 mg via RESPIRATORY_TRACT

## 2020-09-21 ENCOUNTER — Encounter: Payer: Self-pay | Admitting: Internal Medicine

## 2020-09-21 ENCOUNTER — Other Ambulatory Visit: Payer: Self-pay

## 2020-09-21 ENCOUNTER — Ambulatory Visit (INDEPENDENT_AMBULATORY_CARE_PROVIDER_SITE_OTHER): Payer: BC Managed Care – PPO | Admitting: Internal Medicine

## 2020-09-21 DIAGNOSIS — R0600 Dyspnea, unspecified: Secondary | ICD-10-CM | POA: Diagnosis not present

## 2020-09-21 DIAGNOSIS — K449 Diaphragmatic hernia without obstruction or gangrene: Secondary | ICD-10-CM | POA: Diagnosis not present

## 2020-09-21 DIAGNOSIS — J479 Bronchiectasis, uncomplicated: Secondary | ICD-10-CM | POA: Diagnosis not present

## 2020-09-21 DIAGNOSIS — J9611 Chronic respiratory failure with hypoxia: Secondary | ICD-10-CM

## 2020-09-21 DIAGNOSIS — R0609 Other forms of dyspnea: Secondary | ICD-10-CM

## 2020-09-21 NOTE — Patient Instructions (Addendum)
I very strongly recommend you get the moderna or pfizer vaccine as soon as possible based on your risk of dying from the virus  and the proven safety and benefit of these vaccines against even the delta and omicron variants.  This can save your life as well as  those of your loved ones,  especially if they are also not vaccinated.   To get the most out of exercise, you need to be continuously aware that you are short of breath, but never out of breath, for 30 minutes twice daily.   Make sure you check your oxygen saturation at your highest level of activity to be sure it stays over 90% and keep track of it at least once a week, more often if breathing getting worse, and let me know if losing ground.    I will be referring you to Dr [name] re: your large Hiatal Hernia   Please schedule a follow up office visit in 6 weeks, call sooner if needed

## 2020-09-21 NOTE — Progress Notes (Unsigned)
Raymond Watson, male    DOB: [birth year]  MRN: [id number]   Brief patient profile:  50 yo male never smoker/ never vaccinated  with h/o gerd and hyperlipidemia then acutely ill around Apr 28 2020   [facility] Internal Medicine Discharge Summary    Admit date: 04/29/2020 Discharge date: 05/10/2020 Length of stay: LOS: 11 days   Discharge Service: [facility] Internal Medicine Discharge Attending Physician: Kirstie Peri Discharge to: To Home Condition at Discharge: fair Code Status: Full Code  Patient Care Team: Hortencia Pilar, MD as PCP - General (Internal Medicine)  Consults   none  Discharge Diagnoses  Principal Problem: Pneumonia due to COVID-19 virus Active Problems: Hypoxia Diarrhea Hyperlipemia AKI (acute kidney injury) (CMS-HCC) Hiatal hernia Acute respiratory failure with hypoxia (CMS-HCC)    Hospital Course  Hello hello 50 year old gentleman 72 with history of hypertension comes with shortness of breath noted to have pneumonia associated with COVID-19 virus patient was admitted to hospital treated with antibiotics remdesivir and anticoagulation and followed closely management he slowly started to improve his oxygen level was better and lately at 4 L nasal cannula he was stable activity was increased and was discharged on medications noted below with a plan to follow him up in 1 week.  I spent greater than 30 mins in the discharge of this patient.  Procedures   none  Discharge Medications    Your Medication List   STOP taking these medications  azithromycin 250 MG tablet Commonly known as: ZITHROMAX Z-PAK  brompheniramine-pseudoephedrine-DM 2-30-10 mg/5 mL syrup Commonly known as: BROMFED DM   START taking these medications  albuterol 90 mcg/actuation inhaler Commonly known as: PROVENTIL HFA;VENTOLIN HFA Inhale 2 puffs every six (6) hours as needed for wheezing.   CONTINUE taking these medications  predniSONE 5 MG tablet Commonly known as: DELTASONE Take  6-5-4-3-2-1 po qd  rosuvastatin 5 MG tablet Commonly known as: CRESTOR Take 5 mg by mouth daily.    Pending Test Results   none  Lab Results   BLOOD No results in the last week Recent Labs  Lab Units 05/08/20 0412 05/07/20 0413  SODIUM mmol/L 135 136  POTASSIUM mmol/L 4.2 4.2  CHLORIDE mmol/L 101 103  CO2 mmol/L 29.3 28.2  BUN mg/dL 27* 29*  CREATININE mg/dL 0.62* 3.76*  GLUCOSE mg/dL 96 99  CALCIUM mg/dL 7.9* 8.2*   No results in the last week No results in the last week No results in the last week  No results in the last week URINE No results in the last week No results in the last week BODY FLUIDS No results in the last week ABG No results for input(s): O2SOUR, FIO2ART, PHART, PCO2ART, PO2ART, HCO3ART, O2SATART, BEART in the last 72 hours.  Microbiology Results (last day)  ** No results found for the last 24 hours. **    Imaging   ECG 12 Lead  Result Date: 04/30/2020 Normal sinus rhythm Abnormal QRS-T angle, consider primary T wave abnormality Abnormal ECG When compared with ECG of 28-Apr-2020 23:24, Vent. rate has decreased BY 58 BPM Confirmed by Gwenyth Bouillon ([id number]) on 04/30/2020 4:18:53 AM  XR Chest Portable  Result Date: 04/29/2020 CLINICAL DATA: Headache, body aches and cough which began Tuesday, COVID-19 positive EXAM: PORTABLE CHEST 1 VIEW COMPARISON: None. FINDINGS: Patchy heterogeneous opacities are present in both lungs with low volumes and streaky areas of more atelectatic change. No pneumothorax or visible effusion. There is a large air-filled hiatal hernia versus left diaphragmatic eventration. Remaining cardiomediastinal contours are unremarkable.  Gaseous distension of the colonic segments in the upper abdomen is nonspecific. No other acute or suspicious osseous or soft tissue abnormality. Mild degenerative changes in the shoulders.   1. Patchy heterogeneous opacities in both lungs consistent with pneumonia in the setting of COVID-19  positivity. 2. Large air-filled hiatal hernia versus left diaphragmatic eventration. 3. Air-filled loops of colon in the upper abdomen, nonspecific, correlate for abdominal symptoms. Electronically Signed By: Kreg Shropshire M.D. On: 04/29/2020 02:09   CTA Chest W Contrast  Result Date: 04/30/2020 CLINICAL DATA: Shortness of breath. Concern for pulmonary embolism. COVID positive. EXAM: CT ANGIOGRAPHY CHEST WITH CONTRAST TECHNIQUE: Multidetector CT imaging of the chest was performed using the standard protocol during bolus administration of intravenous contrast. Multiplanar CT image reconstructions and MIPs were obtained to evaluate the vascular anatomy. CONTRAST: 100 cc of Isovue 370. COMPARISON: None. FINDINGS: Cardiovascular: Evaluation is limited by respiratory motion artifact. Given this significant limitation, no definite acute pulmonary embolism was detected. The heart size is unremarkable. There is no significant pericardial effusion. No evidence for thoracic aortic aneurysm or dissection. Mediastinum/Nodes: -- No mediastinal lymphadenopathy. -- No hilar lymphadenopathy. -- No axillary lymphadenopathy. -- No supraclavicular lymphadenopathy. -- Normal thyroid gland where visualized. -there is a large hiatal hernia with evidence for an organoaxial gastric volvulus. There is no definite evidence for obstruction. Lungs/Pleura: There are diffuse bilateral hazy airspace opacities consistent with the patient's history of viral pneumonia. There is significant compressive atelectasis involving the left lower lobe which is nearly entirely collapsed. There is a moderate amount of atelectasis within the lingula. There is no pneumothorax. The trachea is unremarkable. Upper Abdomen: Contrast bolus timing is not optimized for evaluation of the abdominal organs. The visualized portions of the organs of the upper abdomen are normal. Musculoskeletal: No chest wall abnormality. No bony spinal canal stenosis. Review of the MIP  images confirms the above findings. .   1. Evaluation is limited by respiratory motion artifact. Given this significant limitation, no definite acute pulmonary embolism was detected. 2. Diffuse bilateral hazy airspace opacities consistent with the patient's history of viral pneumonia. 3. Large hiatal hernia with the majority of the patient's stomach herniated into the thoracic cavity. The orientation of the stomach is consistent with an organoaxial gastric volvulus without evidence for obstruction. There is no definite evidence for obstruction. Electronically Signed By: Katherine Mantle M.D. On: 04/30/2020 20:09   Discharge Instructions   Resources and Referrals  Oxygen (DME)  Oxygen Type: Continuous  % saturation on room air at rest: 88  Provide Continous Oxygen at (LPM): 4  Oxygen device delivery method: Nasal Cannula  Type of portable O2 tank and concentrator to deliver: Gaseous  Other Orders: N/A  Length of Need: 99  Room Air Testing by pulse oximetry or ABG (Arterial Blood Gas): 88 % saturation rate on room air at rest. Please provide continuous oxygen at 4LPM via nasal cannula. Please deliver portable O2 tanks and concentrator to patient.   Room Air Testing by pulse oximetry or ABG (Arterial Blood Gas): 88 % saturation rate on room air at rest. Please provide continuous oxygen at 4LPM via nasal cannula. Please deliver portable O2 tanks and concentrator to patient.         History of Present Illness  07/24/2020  Pulmonary/ 1st office eval/ [facility] / [facility] Office  Chief Complaint  Patient presents with  . Consult    No complaints currently, currently on 2L O2  Dyspnea:  Mailbox is downhill from house and can back up  hill to house s stopping at slower than usual pace but sats so far above 90% s  02  Cough: much better  Sleep: sleeps on pillows at about 30 degrees  SABA use: symbicort 160 2bid / twice albuterol  02 2lpm concentrator at hs and 24/7. rx Plan A = Automatic =  Always=    Symbicort 160 Take 1- 2 puffs first thing in am and then another 1-2 puffs about 12 hours later.  Work on inhaler technique:   Plan B = Backup (to supplement plan A, not to replace it) Only use your albuterol inhaler as a rescue medication  Ok to try albuterol (on alternate days) 15 min before an activity that you know would make you short of breath and see if it makes any difference and if makes none then don't take it after activity unless you can't catch your breath. Make sure you check your oxygen saturation  at your highest level of activity  to be sure it stays over 90% and adjust  02 flow upward to maintain this level if needed but remember to turn it back to previous settings when you stop (to conserve your supply).  Continue 2lpm at bedtime and the rest of the day only if needed    08/08/2020  f/u ov/[place] office/[facility] re: post covid doe / bronchiectasis on ct on symbicort 160 2 in am / 1 in pm Chief Complaint  Patient presents with  . Follow-up    Breathing is unchanged since the last visit. He has only used his albuterol x 1 since his last visit 07/24/20. He states using o2 with sleep but not during the day since sats have stayed above 90% ra.   Dyspnea:  Mailbox and back better sats 94% s cp on exr  Cough: better / min mucoid Sleeping: flat bed, bunch of pillows SABA use: rarely 02: 2lph hs  Chest discomfort L of sternum / never noct  / never pleuritic or ex  Can't do steps due to sob and job in [employer]  requires lifting/ steps rec Work on inhaler technique:    To get the most out of exercise, you need to be continuously aware that you are short of breath, but never out of breath, for 30 minutes twice daily.   Make sure you check your oxygen saturation at your highest level of activity to be sure it stays over 90% and keep track of it at least once a week, more often if breathing getting worse, and let me know if losing ground.   I very strongly recommend you get the  moderna or pfizer vaccine   GERD  Diet  Please remember to go to the lab department @ [facility] for your tests - we will call you with the results when they are available.      Please schedule a follow up office visit in 4 weeks, call sooner if needed -  We will re-evaluate your ability to work then   PFT's on return    09/21/2020  f/u ov/[place] office/[facility] re: pos covid 04/2020 Chief Complaint  Patient presents with  . Follow-up    PFT done 09/19/20. Breathing is improved since the last visit. No new co's. He has not had to use his rescue inhaler.    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   Sleeping  without nocturnal  or early am exacerbation  of respiratory  c/o's or need for noct saba. Also denies any obvious fluctuation of symptoms with weather or environmental changes or other aggravating or alleviating factors except as outlined above   No unusual exposure hx or h/o childhood pna/ asthma or knowledge of premature birth.  Current Allergies, Complete Past Medical History, Past Surgical History, Family History, and Social History were reviewed in [employer] record.  ROS  The following are not active complaints unless bolded Hoarseness, sore throat, dysphagia, dental problems, itching, sneezing,  nasal congestion or discharge of excess mucus or purulent secretions, ear ache,   fever, chills, sweats, unintended wt loss or wt gain, classically pleuritic or exertional cp,  orthopnea pnd or arm/hand swelling  or leg swelling,  presyncope, palpitations, abdominal pain, anorexia, nausea, vomiting, diarrhea  or change in bowel habits or change in bladder habits, change in stools or change in urine, dysuria, hematuria,  rash, arthralgias, visual complaints, headache, numbness, weakness or ataxia or problems with walking or coordination,  change in mood or  memory.        Current Meds  Medication Sig  . albuterol (VENTOLIN HFA) 108 (90 Base) MCG/ACT inhaler Inhale 2 puffs into the lungs every 6 (six) hours.  Marland Kitchen dexlansoprazole (DEXILANT) 60 MG capsule Take 60 mg by mouth daily.  . rosuvastatin (CRESTOR) 5 MG tablet Take 5 mg by mouth daily.  . SYMBICORT 160-4.5 MCG/ACT inhaler Inhale 2 puffs into the lungs in the morning and at bedtime.                        Past Medical History:  Diagnosis Date  . Hyperlipidemia   . Pneumonia        Objective:       Wt Readings from Last 3 Encounters:  09/21/20 174 lb 12.8 oz (79.3 kg)  08/08/20 174 lb (78.9 kg)  07/24/20 171 lb 6.4 oz (77.7 kg)      Vital signs reviewed  09/21/2020  - Note at rest 02 sats  100% on RA   General appearance:    amb bm nad      HEENT : pt wearing mask not removed for exam due to covid -19 concerns.    NECK :  without JVD/Nodes/TM/ nl carotid upstrokes bilaterally   LUNGS: no acc muscle use,  Nl contour chest which is clear to A and P bilaterally without cough on insp or exp maneuvers   CV:  RRR  no s3 or murmur or increase in P2, and no edema   ABD:  soft and nontender with nl inspiratory excursion in the supine position. No bruits or organomegaly appreciated, bowel sounds nl  MS:  Nl gait/ ext warm without deformities, calf tenderness, cyanosis or clubbing No obvious joint restrictions   SKIN: warm and dry without lesions    NEURO:  alert, approp, nl sensorium with  no motor or cerebellar deficits apparent.          I personally reviewed images and agree with radiology impression as follows:  CXR:   07/24/20   1. Improving appearance of  the chest without signs of consolidation or effusion. 2. Large hiatal hernia.        Assessment

## 2020-09-22 ENCOUNTER — Encounter: Payer: Self-pay | Admitting: Internal Medicine

## 2020-09-22 NOTE — Assessment & Plan Note (Signed)
CT chest 06/09/20 p covid (dx 04/29/20)  changes with tubular bronchiectasis both lower lobes -  Repeat CT = HRCT rec for 12/07/20 (placed in reminder file )  - 07/24/2020   Demonstrated approp hfa > use symbicort 160 1-2 bid and approp saba  - 08/08/2020  After extensive coaching inhaler device,  effectiveness =    75% from baseline 25%  - PFT's 09/19/20  No obstruction p symbicort in am   rec continue symbicort for now, longterm probably shift to prn Based on two studies from NEJM  378; 20 p 1865 (2018) and 380 : p2020-30 (2019) in pts with mild asthma it is reasonable to use low dose symbicort eg 80 2bid "prn" flare in this setting but I emphasized this was only shown with symbicort and takes advantage of the rapid onset of action but is not the same as "rescue therapy" but can be stopped once the acute symptoms have resolved and the need for rescue has been minimized (< 2 x weekly)

## 2020-09-22 NOTE — Assessment & Plan Note (Signed)
Onset Oct 2021 with covid -   07/24/2020   Walked RA  approx   600 ft  @ fast pace  stopped due to  End of study s sob and sats 96%  - as of 09/21/2020  Just using 02 1lpm hs   Probably ok to wean this off completely given lack of desat at peak ex

## 2020-09-22 NOTE — Assessment & Plan Note (Signed)
Onset was Apr 28 2020 with covid  -  07/24/2020   Walked RA  approx   600 ft  @ fast pace  stopped due to  End of study s sob and sats 96% - PFT's  09/19/2020  FEV1 2.27 (69 % ) ratio 0.68  p 0 % improvement from saba p symbicort  prior to study with DLCO  24.89 (72%) corrects to 4.55 (106%)  for alv volume and FV curve nl   - 09/21/2020 )- walked moderate pace/2 addiotional laps = 500 ft- sat at end 96%ra and pulse 124/No SOB   He has had good recovery to this point but may not be able to return to prior job requiring heavy lifting s more intense rehab first and likely needs GI surgery for the restrictive component seen on pfts so rec refer to surgery/ work on rehab and regroup in 6 weeks  Each maintenance medication was reviewed in detail including emphasizing most importantly the difference between maintenance and prns and under what circumstances the prns are to be triggered using an action plan format where appropriate.  Total time for H and P, chart review, counseling,  directly observing portions of ambulatory 02 saturation study/ and generating customized AVS unique to this office visit / same day charting = 30 min

## 2020-10-03 ENCOUNTER — Telehealth: Payer: Self-pay | Admitting: Internal Medicine

## 2020-10-03 NOTE — Telephone Encounter (Signed)
Spoke with pt and reschulded OV for 11/13/20 in [place] office. Nothing further needed at this time. Routing to Dr. Sherene Sires as Lorain Childes

## 2020-10-03 NOTE — Telephone Encounter (Signed)
MW please advise if we need to change his appt with you to after the appt with Dr. [name].  Thanks

## 2020-10-03 NOTE — Telephone Encounter (Signed)
Fine to move it wherever he feels will work better for him in coordination with Martin's eval

## 2020-10-18 ENCOUNTER — Telehealth: Payer: Self-pay | Admitting: Internal Medicine

## 2020-10-18 NOTE — Telephone Encounter (Signed)
Spoke with spouse ok per DPR  She states needs the forms for disability refaxed  They says they need more info but they do not know what the info is  Forms faxed again with note to let us know what specifically is need if anything else

## 2020-10-27 ENCOUNTER — Other Ambulatory Visit: Payer: Self-pay | Admitting: Surgery

## 2020-10-27 DIAGNOSIS — K449 Diaphragmatic hernia without obstruction or gangrene: Secondary | ICD-10-CM

## 2020-11-02 ENCOUNTER — Ambulatory Visit: Payer: BC Managed Care – PPO | Admitting: Internal Medicine

## 2020-11-02 ENCOUNTER — Encounter: Payer: Self-pay | Admitting: *Deleted

## 2020-11-03 ENCOUNTER — Telehealth: Payer: Self-pay | Admitting: Internal Medicine

## 2020-11-09 ENCOUNTER — Other Ambulatory Visit: Payer: Self-pay

## 2020-11-09 ENCOUNTER — Ambulatory Visit
Admission: RE | Admit: 2020-11-09 | Discharge: 2020-11-09 | Disposition: A | Discharge status: Home / Self Care | Payer: BC Managed Care – PPO | Source: Ambulatory Visit | Attending: Surgery | Admitting: Surgery

## 2020-11-09 DIAGNOSIS — K449 Diaphragmatic hernia without obstruction or gangrene: Secondary | ICD-10-CM

## 2020-11-13 ENCOUNTER — Encounter: Payer: Self-pay | Admitting: Internal Medicine

## 2020-11-13 ENCOUNTER — Other Ambulatory Visit: Payer: Self-pay

## 2020-11-13 ENCOUNTER — Ambulatory Visit (INDEPENDENT_AMBULATORY_CARE_PROVIDER_SITE_OTHER): Payer: BC Managed Care – PPO | Admitting: Internal Medicine

## 2020-11-13 DIAGNOSIS — R0600 Dyspnea, unspecified: Secondary | ICD-10-CM

## 2020-11-13 DIAGNOSIS — R0609 Other forms of dyspnea: Secondary | ICD-10-CM

## 2020-11-13 NOTE — Patient Instructions (Addendum)
To get the most out of exercise, you need to be continuously aware that you are short of breath, but never out of breath, for at least 30 minutes at least twice daily. As you improve, it will actually be easier for you to do the same amount of exercise  in  30 minutes so always push to the level where you are short of breath.  We will cancel your oxygen     Please schedule a follow up visit in 3 months but call sooner if needed  with all medications /inhalers/ solutions in hand so we can verify exactly what you are taking. This includes all medications from all doctors and over the counters

## 2020-11-13 NOTE — Progress Notes (Signed)
Raymond Watson, male    DOB: 07/14/1971  MRN: [id number]   Brief patient profile:  50 yo male never smoker/ never vaccinated  with h/o gerd and hyperlipidemia then acutely ill around Apr 28 2020   [facility] Internal Medicine Discharge Summary    Admit date: 04/29/2020 Discharge date: 05/10/2020 Length of stay: LOS: 11 days   Discharge Service: [facility] Internal Medicine Discharge Attending Physician: Kirstie Peri Discharge to: To Home Condition at Discharge: fair Code Status: Full Code  Patient Care Team: Hortencia Pilar, MD as PCP - General (Internal Medicine)  Consults   none  Discharge Diagnoses  Principal Problem: Pneumonia due to COVID-19 virus Active Problems: Hypoxia Diarrhea Hyperlipemia AKI (acute kidney injury) (CMS-HCC) Hiatal hernia Acute respiratory failure with hypoxia (CMS-HCC)    Hospital Course  Hello hello 50 year old gentleman 44 with history of hypertension comes with shortness of breath noted to have pneumonia associated with COVID-19 virus patient was admitted to hospital treated with antibiotics remdesivir and anticoagulation and followed closely management he slowly started to improve his oxygen level was better and lately at 4 L nasal cannula he was stable activity was increased and was discharged on medications noted below with a plan to follow him up in 1 week.  I spent greater than 30 mins in the discharge of this patient.  Procedures   none  Discharge Medications    Your Medication List   STOP taking these medications  azithromycin 250 MG tablet Commonly known as: ZITHROMAX Z-PAK  brompheniramine-pseudoephedrine-DM 2-30-10 mg/5 mL syrup Commonly known as: BROMFED DM   START taking these medications  albuterol 90 mcg/actuation inhaler Commonly known as: PROVENTIL HFA;VENTOLIN HFA Inhale 2 puffs every six (6) hours as needed for wheezing.   CONTINUE taking these medications  predniSONE 5 MG tablet Commonly known as: DELTASONE Take  6-5-4-3-2-1 po qd  rosuvastatin 5 MG tablet Commonly known as: CRESTOR Take 5 mg by mouth daily.    Pending Test Results   none  Lab Results   BLOOD No results in the last week Recent Labs  Lab Units 05/08/20 0412 05/07/20 0413  SODIUM mmol/L 135 136  POTASSIUM mmol/L 4.2 4.2  CHLORIDE mmol/L 101 103  CO2 mmol/L 29.3 28.2  BUN mg/dL 27* 29*  CREATININE mg/dL 8.84* 1.66*  GLUCOSE mg/dL 96 99  CALCIUM mg/dL 7.9* 8.2*   No results in the last week No results in the last week No results in the last week  No results in the last week URINE No results in the last week No results in the last week BODY FLUIDS No results in the last week ABG No results for input(s): O2SOUR, FIO2ART, PHART, PCO2ART, PO2ART, HCO3ART, O2SATART, BEART in the last 72 hours.  Microbiology Results (last day)  ** No results found for the last 24 hours. **    Imaging   ECG 12 Lead  Result Date: 04/30/2020 Normal sinus rhythm Abnormal QRS-T angle, consider primary T wave abnormality Abnormal ECG When compared with ECG of 28-Apr-2020 23:24, Vent. rate has decreased BY 58 BPM Confirmed by Gwenyth Bouillon ([id number]) on 04/30/2020 4:18:53 AM  XR Chest Portable  Result Date: 04/29/2020 CLINICAL DATA: Headache, body aches and cough which began Tuesday, COVID-19 positive EXAM: PORTABLE CHEST 1 VIEW COMPARISON: None. FINDINGS: Patchy heterogeneous opacities are present in both lungs with low volumes and streaky areas of more atelectatic change. No pneumothorax or visible effusion. There is a large air-filled hiatal hernia versus left diaphragmatic eventration. Remaining cardiomediastinal contours are unremarkable.  Gaseous distension of the colonic segments in the upper abdomen is nonspecific. No other acute or suspicious osseous or soft tissue abnormality. Mild degenerative changes in the shoulders.   1. Patchy heterogeneous opacities in both lungs consistent with pneumonia in the setting of COVID-19  positivity. 2. Large air-filled hiatal hernia versus left diaphragmatic eventration. 3. Air-filled loops of colon in the upper abdomen, nonspecific, correlate for abdominal symptoms. Electronically Signed By: Kreg Shropshire M.D. On: 04/29/2020 02:09   CTA Chest W Contrast  Result Date: 04/30/2020 CLINICAL DATA: Shortness of breath. Concern for pulmonary embolism. COVID positive. EXAM: CT ANGIOGRAPHY CHEST WITH CONTRAST TECHNIQUE: Multidetector CT imaging of the chest was performed using the standard protocol during bolus administration of intravenous contrast. Multiplanar CT image reconstructions and MIPs were obtained to evaluate the vascular anatomy. CONTRAST: 100 cc of Isovue 370. COMPARISON: None. FINDINGS: Cardiovascular: Evaluation is limited by respiratory motion artifact. Given this significant limitation, no definite acute pulmonary embolism was detected. The heart size is unremarkable. There is no significant pericardial effusion. No evidence for thoracic aortic aneurysm or dissection. Mediastinum/Nodes: -- No mediastinal lymphadenopathy. -- No hilar lymphadenopathy. -- No axillary lymphadenopathy. -- No supraclavicular lymphadenopathy. -- Normal thyroid gland where visualized. -there is a large hiatal hernia with evidence for an organoaxial gastric volvulus. There is no definite evidence for obstruction. Lungs/Pleura: There are diffuse bilateral hazy airspace opacities consistent with the patient's history of viral pneumonia. There is significant compressive atelectasis involving the left lower lobe which is nearly entirely collapsed. There is a moderate amount of atelectasis within the lingula. There is no pneumothorax. The trachea is unremarkable. Upper Abdomen: Contrast bolus timing is not optimized for evaluation of the abdominal organs. The visualized portions of the organs of the upper abdomen are normal. Musculoskeletal: No chest wall abnormality. No bony spinal canal stenosis. Review of the MIP  images confirms the above findings. .   1. Evaluation is limited by respiratory motion artifact. Given this significant limitation, no definite acute pulmonary embolism was detected. 2. Diffuse bilateral hazy airspace opacities consistent with the patient's history of viral pneumonia. 3. Large hiatal hernia with the majority of the patient's stomach herniated into the thoracic cavity. The orientation of the stomach is consistent with an organoaxial gastric volvulus without evidence for obstruction. There is no definite evidence for obstruction. Electronically Signed By: Katherine Mantle M.D. On: 04/30/2020 20:09   Discharge Instructions   Resources and Referrals  Oxygen (DME)  Oxygen Type: Continuous  % saturation on room air at rest: 88  Provide Continous Oxygen at (LPM): 4  Oxygen device delivery method: Nasal Cannula  Type of portable O2 tank and concentrator to deliver: Gaseous  Other Orders: N/A  Length of Need: 99  Room Air Testing by pulse oximetry or ABG (Arterial Blood Gas): 88 % saturation rate on room air at rest. Please provide continuous oxygen at 4LPM via nasal cannula. Please deliver portable O2 tanks and concentrator to patient.   Room Air Testing by pulse oximetry or ABG (Arterial Blood Gas): 88 % saturation rate on room air at rest. Please provide continuous oxygen at 4LPM via nasal cannula. Please deliver portable O2 tanks and concentrator to patient.         History of Present Illness  07/24/2020  Pulmonary/ 1st office eval/ Raymond Watson / [facility] Office  Chief Complaint  Patient presents with  . Consult    No complaints currently, currently on 2L O2  Dyspnea:  Mailbox is downhill from house and can back up  hill to house s stopping at slower than usual pace but sats so far above 90% s  02  Cough: much better  Sleep: sleeps on pillows at about 30 degrees  SABA use: symbicort 160 2bid / twice albuterol  02 2lpm concentrator at hs and 24/7. rx Plan A = Automatic =  Always=    Symbicort 160 Take 1- 2 puffs first thing in am and then another 1-2 puffs about 12 hours later.  Work on inhaler technique:   Plan B = Backup (to supplement plan A, not to replace it) Only use your albuterol inhaler as a rescue medication  Ok to try albuterol (on alternate days) 15 min before an activity that you know would make you short of breath and see if it makes any difference and if makes none then don't take it after activity unless you can't catch your breath. Make sure you check your oxygen saturation  at your highest level of activity  to be sure it stays over 90% and adjust  02 flow upward to maintain this level if needed but remember to turn it back to previous settings when you stop (to conserve your supply).  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 Follow-up    Breathing is unchanged since the last visit. He has only used his albuterol x 1 since his last visit 07/24/20. He states using o2 with sleep but not during the day since sats have stayed above 90% ra.   Dyspnea:  Mailbox and back better sats 94% s cp on exr  Cough: better / min mucoid Sleeping: flat bed, bunch of pillows SABA use: rarely 02: 2lph hs  Chest discomfort L of sternum / never noct  / never pleuritic or ex  Can't do steps due to sob and job in [employer]  requires lifting/ steps rec Work on inhaler technique:    To get the most out of exercise, you need to be continuously aware that you are short of breath, but never out of breath, for 30 minutes twice daily.   Make sure you check your oxygen saturation at your highest level of activity to be sure it stays over 90% and keep track of it at least once a week, more often if breathing getting worse, and let me know if losing ground.   I very strongly recommend you get the  moderna or pfizer vaccine   GERD  Diet  Please remember to go to the lab department @ [facility] for your tests - we will call you with the results when they are available.      Please schedule a follow up office visit in 4 weeks, call sooner if needed -  We will re-evaluate your ability to work then   PFT's on return    09/21/2020  f/u ov/ office/Raymond Watson re: pos covid 04/2020 Chief Complaint  Patient presents with  . Follow-up    PFT done 09/19/20. Breathing is improved since the last visit. No new co's. He has not had to use his rescue inhaler.   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   Make sure you check your oxygen saturation at your highest level of activity to be sure it stays over 90% and keep track of it at least once a week, more often if breathing getting worse, and let me know if losing ground.  I will be referring you to Dr [name] re: your large Hiatal Hernia  Please schedule a follow up office visit in 6 weeks, call sooner if needed    11/13/2020  f/u ov/[place] office/Raymond Watson re: doe p covid Chief Complaint  Patient presents with  . Follow-up    Breathing has improved since the last visit and no new co's today. He rarely uses his albuterol.   Dyspnea: walking to farm x one mile there and back 95%  Twice daily / steps x 5 landing  Cough: none  Sleeping: fine resp wise  SABA use: just symbicort  02: none  Covid status: never vaccinated  Lung cancer screening: never  Smoker Cannot return for  light duty and has not been paid since Feb 22nd    No obvious day to day or daytime variability or assoc excess/ purulent sputum or mucus plugs or hemoptysis or cp or chest tightness, subjective wheeze or overt sinus or hb symptoms.   Sleeping without nocturnal  or early am exacerbation  of respiratory  c/o's or need for noct saba. Also denies any obvious fluctuation of symptoms with weather or environmental changes or other aggravating or alleviating factors except as outlined above   No unusual exposure hx or h/o childhood pna/ asthma or knowledge of premature birth.  Current Allergies, Complete Past Medical History, Past Surgical History, Family History, and Social History were reviewed in [employer] record.  ROS  The following are not active complaints unless bolded Hoarseness, sore throat, dysphagia, dental problems, itching, sneezing,  nasal congestion or discharge of excess mucus or purulent secretions, ear ache,   fever, chills, sweats, unintended wt loss or wt gain, classically pleuritic or exertional cp,  orthopnea pnd or arm/hand swelling  or leg swelling, presyncope, palpitations, abdominal pain, anorexia, nausea, vomiting, diarrhea  or change in bowel habits or change in bladder habits, change in stools or change in urine, dysuria, hematuria,  rash, arthralgias, visual complaints, headache, numbness, weakness or ataxia or problems with walking or coordination,  change in mood or  memory.        Current Meds  Medication Sig  . albuterol (VENTOLIN HFA) 108 (90 Base) MCG/ACT inhaler Inhale 2 puffs into the lungs every 6 (six) hours.  Marland Kitchen dexlansoprazole (DEXILANT) 60 MG capsule Take 60 mg by mouth daily.  . rosuvastatin (CRESTOR) 5 MG tablet Take 5 mg by mouth daily.  . SYMBICORT 160-4.5 MCG/ACT inhaler Inhale 2 puffs into the lungs in the morning and at bedtime.                             Past Medical History:  Diagnosis Date  . Hyperlipidemia    . Pneumonia        Objective:       11/13/2020  173  09/21/20 174 lb 12.8 oz (79.3 kg)  08/08/20 174 lb (78.9 kg)  07/24/20 171 lb 6.4 oz (77.7 kg)    Vital signs reviewed  11/13/2020  - Note at rest 02 sats  100% on RA   General appearance:    amb male nad    HEENT : pt wearing mask not removed for exam due to covid -19 concerns.    NECK :  without JVD/Nodes/TM/ nl carotid upstrokes bilaterally   LUNGS: no acc muscle use,  Nl contour chest which is clear to A and P bilaterally without cough on insp or exp maneuvers   CV:  RRR  no s3 or murmur or increase in P2, and no edema   ABD:  soft and nontender with nl inspiratory excursion in the supine position. No bruits or organomegaly appreciated, bowel sounds nl  MS:  Nl gait/ ext warm without deformities, calf tenderness, cyanosis or clubbing No obvious joint restrictions   SKIN: warm and dry without lesions    NEURO:  alert, approp, nl sensorium with  no motor or cerebellar deficits apparent.         I personally reviewed images and agree with radiology impression as follows:  DG UGI   Large hiatal hernia containing most of the stomach which appears to be paraesophageal type. Small amount of reflux.          Assessment

## 2020-11-14 ENCOUNTER — Encounter: Payer: Self-pay | Admitting: Internal Medicine

## 2020-11-14 NOTE — Assessment & Plan Note (Signed)
Onset was Apr 28 2020 with covid  -  07/24/2020   Walked RA  approx   600 ft  @ fast pace  stopped due to  End of study s sob and sats 96% - PFT's  09/19/2020  FEV1 2.27 (69 % ) ratio 0.68  p 0 % improvement from saba p symbicort  prior to study with DLCO  24.89 (72%) corrects to 4.55 (106%)  for alv volume and FV curve nl   - 09/21/2020 )- walked moderate pace/2 addiotional laps = 500 ft- sat at end 96%ra and pulse 124/No SOB   pfts are c/w restrictive changes with nl dlco most c/w mass effects of large HH and will only be corrected surgically > planned for 1st week in May 2022.  In meantime has not reconditioned to point where likely to be able to resume prior heavy duty work - this is a result of the acute physiologic changes we see with covid but are not specific pulmonary limitations at this point   rec Reconditioning reviewed Return to work when able to physically perform duties or start with a lighter workload (avoid any heavy lifting/ steps) .   Pulmonary f/u at 3 months final.         Each maintenance medication was reviewed in detail including emphasizing most importantly the difference between maintenance and prns and under what circumstances the prns are to be triggered using an action plan format where appropriate.  Total time for H and P, chart review, counseling,  and generating customized AVS unique to this office visit / same day charting = 26 min

## 2020-11-15 NOTE — Telephone Encounter (Signed)
Rec'd form back - Faxed to [facility] at [phone]

## 2020-11-23 NOTE — Progress Notes (Signed)
Sent message, via epic in basket, requesting orders in epic from surgeon.  

## 2020-11-28 NOTE — Patient Instructions (Addendum)
DUE TO COVID-19 ONLY ONE VISITOR IS ALLOWED TO COME WITH YOU AND STAY IN THE WAITING ROOM ONLY DURING PRE OP AND PROCEDURE DAY OF SURGERY. THE 1 VISITOR  MAY VISIT WITH YOU AFTER SURGERY IN YOUR PRIVATE ROOM DURING VISITING HOURS ONLY!  YOU NEED TO HAVE A COVID 19 TEST ON_5/5______ @_10 :30______, THIS TEST MUST BE DONE BEFORE SURGERY,  COVID TESTING SITE [address] [place] , IT IS ON THE RIGHT GOING OUT [address] APPROXIMATELY  2 MINUTES PAST [employer] ON THE RIGHT. ONCE YOUR COVID TEST IS COMPLETED,  PLEASE BEGIN THE QUARANTINE INSTRUCTIONS AS OUTLINED IN YOUR HANDOUT.                Francois Elk   Your procedure is scheduled on: 12/04/20   Report to [facility] Main  Entrance   Report to admitting at  11:00 AM     Call this number if you have problems the morning of surgery [phone]    Remember: Do not eat food or drink liquids :After Midnight.   BRUSH YOUR TEETH MORNING OF SURGERY AND RINSE YOUR MOUTH OUT, NO CHEWING GUM CANDY OR MINTS.     Take these medicines the morning of surgery with A SIP OF WATER: Use your inhalers and bring them with you                                You may not have any metal on your body including              piercings  Do not wear jewelry,  lotions, powders or deodorant              Men may shave face and neck.   Do not bring valuables to the hospital. [facility] IS NOT             RESPONSIBLE   FOR VALUABLES.  Contacts, dentures or bridgework may not be worn into surgery.                [facility] - Preparing for Surgery Before surgery, you can play an important role.  Because skin is not sterile, your skin needs to be as free of germs as possible.  You can reduce the number of germs on your skin by washing with CHG (chlorahexidine gluconate) soap before surgery.  CHG is an antiseptic cleaner which kills germs and bonds with the skin to continue killing germs even after washing. Please DO  NOT use if you have an allergy to CHG or antibacterial soaps.  If your skin becomes reddened/irritated stop using the CHG and inform your nurse when you arrive at Short Stay.   You may shave your face/neck.  Please follow these instructions carefully:  1.  Shower with CHG Soap the night before surgery and the  morning of Surgery.  2.  If you choose to wash your hair, wash your hair first as usual with your  normal  shampoo.  3.  After you shampoo, rinse your hair and body thoroughly to remove the  shampoo.                                        4.  Use CHG as you would any other liquid soap.  You can apply chg directly  to the skin and wash                       Gently with a scrungie or clean washcloth.  5.  Apply the CHG Soap to your body ONLY FROM THE NECK DOWN.   Do not use on face/ open                           Wound or open sores. Avoid contact with eyes, ears mouth and genitals (private parts).                       Wash face,  Genitals (private parts) with your normal soap.             6.  Wash thoroughly, paying special attention to the area where your surgery  will be performed.  7.  Thoroughly rinse your body with warm water from the neck down.  8.  DO NOT shower/wash with your normal soap after using and rinsing off  the CHG Soap.             9.  Pat yourself dry with a clean towel.            10.  Wear clean pajamas.            11.  Place clean sheets on your bed the night of your first shower and do not  sleep with pets. Day of Surgery : Do not apply any lotions/deodorants the morning of surgery.  Please wear clean clothes to the hospital/surgery center.  FAILURE TO FOLLOW THESE INSTRUCTIONS MAY RESULT IN THE CANCELLATION OF YOUR SURGERY PATIENT SIGNATURE_________________________________  NURSE SIGNATURE__________________________________  ________________________________________________________________________   Rogelia Mire  An incentive spirometer is a tool  that can help keep your lungs clear and active. This tool measures how well you are filling your lungs with each breath. Taking long deep breaths may help reverse or decrease the chance of developing breathing (pulmonary) problems (especially infection) following:  A long period of time when you are unable to move or be active. BEFORE THE PROCEDURE   If the spirometer includes an indicator to show your best effort, your nurse or respiratory therapist will set it to a desired goal.  If possible, sit up straight or lean slightly forward. Try not to slouch.  Hold the incentive spirometer in an upright position. INSTRUCTIONS FOR USE  1. Sit on the edge of your bed if possible, or sit up as far as you can in bed or on a chair. 2. Hold the incentive spirometer in an upright position. 3. Breathe out normally. 4. Place the mouthpiece in your mouth and seal your lips tightly around it. 5. Breathe in slowly and as deeply as possible, raising the piston or the ball toward the top of the column. 6. Hold your breath for 3-5 seconds or for as long as possible. Allow the piston or ball to fall to the bottom of the column. 7. Remove the mouthpiece from your mouth and breathe out normally. 8. Rest for a few seconds and repeat Steps 1 through 7 at least 10 times every 1-2 hours when you are awake. Take your time and take a few normal breaths between deep breaths. 9. The spirometer may include an indicator to show your best effort. Use the indicator as a goal to work toward during each repetition. 10. After each set of 10  deep breaths, practice coughing to be sure your lungs are clear. If you have an incision (the cut made at the time of surgery), support your incision when coughing by placing a pillow or rolled up towels firmly against it. Once you are able to get out of bed, walk around indoors and cough well. You may stop using the incentive spirometer when instructed by your caregiver.  RISKS AND  COMPLICATIONS  Take your time so you do not get dizzy or light-headed.  If you are in pain, you may need to take or ask for pain medication before doing incentive spirometry. It is harder to take a deep breath if you are having pain. AFTER USE  Rest and breathe slowly and easily.  It can be helpful to keep track of a log of your progress. Your caregiver can provide you with a simple table to help with this. If you are using the spirometer at home, follow these instructions: [place] IF:   You are having difficultly using the spirometer.  You have trouble using the spirometer as often as instructed.  Your pain medication is not giving enough relief while using the spirometer.  You develop fever of 100.5 F (38.1 C) or higher. SEEK IMMEDIATE MEDICAL CARE IF:   You cough up bloody sputum that had not been present before.  You develop fever of 102 F (38.9 C) or greater.  You develop worsening pain at or near the incision site. MAKE SURE YOU:   Understand these instructions.  Will watch your condition.  Will get help right away if you are not doing well or get worse. Document Released: 11/25/2006 Document Revised: 10/07/2011 Document Reviewed: 01/26/2007 [facility] Patient Information 2014 [place], [place].   ________________________________________________________________________

## 2020-11-29 ENCOUNTER — Ambulatory Visit: Payer: Self-pay | Admitting: Surgery

## 2020-11-29 ENCOUNTER — Telehealth: Payer: Self-pay | Admitting: *Deleted

## 2020-11-29 DIAGNOSIS — J479 Bronchiectasis, uncomplicated: Secondary | ICD-10-CM

## 2020-11-29 NOTE — Telephone Encounter (Signed)
-----   Message from Nyoka Cowden, MD sent at 07/24/2020  9:40 AM EST ----- Needs HRCT 12/07/20

## 2020-11-29 NOTE — Telephone Encounter (Signed)
LMTCB- need to remind the pt that this is due before ordering

## 2020-11-30 ENCOUNTER — Other Ambulatory Visit: Payer: Self-pay

## 2020-11-30 ENCOUNTER — Encounter (HOSPITAL_COMMUNITY)
Admission: RE | Admit: 2020-11-30 | Discharge: 2020-11-30 | Disposition: A | Discharge status: Home / Self Care | Payer: BC Managed Care – PPO | Source: Ambulatory Visit | Attending: Surgery | Admitting: Surgery

## 2020-11-30 ENCOUNTER — Encounter (HOSPITAL_COMMUNITY): Payer: Self-pay

## 2020-11-30 ENCOUNTER — Other Ambulatory Visit (HOSPITAL_COMMUNITY)
Admission: RE | Admit: 2020-11-30 | Discharge: 2020-11-30 | Disposition: A | Discharge status: Home / Self Care | Payer: BC Managed Care – PPO | Source: Ambulatory Visit | Attending: Surgery | Admitting: Surgery

## 2020-11-30 DIAGNOSIS — Z20822 Contact with and (suspected) exposure to covid-19: Secondary | ICD-10-CM | POA: Insufficient documentation

## 2020-11-30 DIAGNOSIS — Z01818 Encounter for other preprocedural examination: Secondary | ICD-10-CM | POA: Insufficient documentation

## 2020-11-30 HISTORY — DX: Essential (primary) hypertension: I10

## 2020-11-30 HISTORY — DX: Headache, unspecified: R51.9

## 2020-11-30 HISTORY — DX: Gastro-esophageal reflux disease without esophagitis: K21.9

## 2020-11-30 LAB — CBC
HCT: 43.5 % (ref 39.0–52.0)
Hemoglobin: 13.9 g/dL (ref 13.0–17.0)
MCH: 26.6 pg (ref 26.0–34.0)
MCHC: 32 g/dL (ref 30.0–36.0)
MCV: 83.2 fL (ref 80.0–100.0)
Platelets: 236 10*3/uL (ref 150–400)
RBC: 5.23 MIL/uL (ref 4.22–5.81)
RDW: 14.7 % (ref 11.5–15.5)
WBC: 7.5 10*3/uL (ref 4.0–10.5)
nRBC: 0 % (ref 0.0–0.2)

## 2020-11-30 NOTE — Telephone Encounter (Signed)
Spoke with the pt and notified he is due for HRCT. He verbalized understanding and I have sent order to [facility].

## 2020-11-30 NOTE — Progress Notes (Addendum)
COVID Vaccine Completed:No Date COVID Vaccine completed: COVID vaccine manufacturer: Pfizer    Moderna   [employer]   PCP - Dr. Franchot Mimes Cardiologist - none  Chest x-ray - no EKG - 11/30/20-chart, epic Stress Test - no ECHO - no Cardiac Cath - no Pacemaker/ICD device last checked:NA  Sleep Study - NA CPAP -   Fasting Bloodno Sugar - NA Checks Blood Sugar _____ times a day  Blood Thinner Instructions:NA Aspirin Instructions: Last Dose:  Anesthesia review:   Patient denies shortness of breath, fever, cough and chest pain at PAT appointment Yes. Pt had Covid in 04/2020 and still need an inhaler for SOB. He does yard work and is active.  Patient verbalized understanding of instructions that were given to them at the PAT appointment. Patient was also instructed that they will need to review over the PAT instructions again at home before surgery.yes. Pt was told to call Dr. Ermalene Searing office to ask about the fleets enema that was ordered. His BP was elevated at the PAT visit. It was taken in both arms prior to the visit and after. Diastolic was above 110. He will call his Dr. Today.

## 2020-12-01 LAB — SARS CORONAVIRUS 2 (TAT 6-24 HRS): SARS Coronavirus 2: NEGATIVE

## 2020-12-03 MED ORDER — BUPIVACAINE LIPOSOME 1.3 % IJ SUSP
20.0000 mL | INTRAMUSCULAR | Status: DC
  Filled 2020-12-03: qty 20

## 2020-12-04 ENCOUNTER — Inpatient Hospital Stay (HOSPITAL_COMMUNITY)
Admission: RE | Admit: 2020-12-04 | Discharge: 2020-12-06 | DRG: 328 | Disposition: A | Discharge status: Home / Self Care | Payer: BC Managed Care – PPO | Source: Ambulatory Visit | Attending: Surgery | Admitting: Surgery

## 2020-12-04 ENCOUNTER — Inpatient Hospital Stay (HOSPITAL_COMMUNITY): Payer: BC Managed Care – PPO | Admitting: Physician Assistant

## 2020-12-04 ENCOUNTER — Inpatient Hospital Stay (HOSPITAL_COMMUNITY): Payer: BC Managed Care – PPO | Admitting: Anesthesiology

## 2020-12-04 ENCOUNTER — Encounter (HOSPITAL_COMMUNITY)
Admission: RE | Disposition: A | Discharge status: Home / Self Care | Payer: Self-pay | Source: Ambulatory Visit | Attending: Surgery

## 2020-12-04 ENCOUNTER — Encounter (HOSPITAL_COMMUNITY): Payer: Self-pay | Admitting: Surgery

## 2020-12-04 DIAGNOSIS — E785 Hyperlipidemia, unspecified: Secondary | ICD-10-CM | POA: Diagnosis present

## 2020-12-04 DIAGNOSIS — Z79899 Other long term (current) drug therapy: Secondary | ICD-10-CM

## 2020-12-04 DIAGNOSIS — K219 Gastro-esophageal reflux disease without esophagitis: Secondary | ICD-10-CM | POA: Diagnosis present

## 2020-12-04 DIAGNOSIS — K449 Diaphragmatic hernia without obstruction or gangrene: Secondary | ICD-10-CM | POA: Diagnosis present

## 2020-12-04 DIAGNOSIS — I10 Essential (primary) hypertension: Secondary | ICD-10-CM | POA: Diagnosis present

## 2020-12-04 DIAGNOSIS — Z8616 Personal history of COVID-19: Secondary | ICD-10-CM | POA: Diagnosis not present

## 2020-12-04 DIAGNOSIS — Z9889 Other specified postprocedural states: Secondary | ICD-10-CM

## 2020-12-04 DIAGNOSIS — J45909 Unspecified asthma, uncomplicated: Secondary | ICD-10-CM | POA: Diagnosis present

## 2020-12-04 DIAGNOSIS — Z7951 Long term (current) use of inhaled steroids: Secondary | ICD-10-CM

## 2020-12-04 DIAGNOSIS — Z8719 Personal history of other diseases of the digestive system: Secondary | ICD-10-CM

## 2020-12-04 HISTORY — PX: XI ROBOTIC ASSISTED HIATAL HERNIA REPAIR: SHX6889

## 2020-12-04 SURGERY — REPAIR, HERNIA, HIATAL, ROBOT-ASSISTED
Anesthesia: General | Site: Abdomen

## 2020-12-04 MED ORDER — ROCURONIUM BROMIDE 10 MG/ML (PF) SYRINGE
PREFILLED_SYRINGE | INTRAVENOUS | Status: DC | PRN
  Administered 2020-12-04: 10 mg via INTRAVENOUS
  Administered 2020-12-04: 50 mg via INTRAVENOUS
  Administered 2020-12-04 (×2): 20 mg via INTRAVENOUS

## 2020-12-04 MED ORDER — MIDAZOLAM HCL 2 MG/2ML IJ SOLN
INTRAMUSCULAR | Status: AC
  Filled 2020-12-04: qty 2

## 2020-12-04 MED ORDER — LABETALOL HCL 5 MG/ML IV SOLN
INTRAVENOUS | Status: DC | PRN
  Administered 2020-12-04: 5 mg via INTRAVENOUS

## 2020-12-04 MED ORDER — BUPIVACAINE-EPINEPHRINE 0.25% -1:200000 IJ SOLN
INTRAMUSCULAR | Status: AC
  Filled 2020-12-04: qty 1

## 2020-12-04 MED ORDER — ALBUMIN HUMAN 5 % IV SOLN
INTRAVENOUS | Status: AC
  Filled 2020-12-04: qty 250

## 2020-12-04 MED ORDER — LIDOCAINE 2% (20 MG/ML) 5 ML SYRINGE
INTRAMUSCULAR | Status: AC
  Filled 2020-12-04: qty 5

## 2020-12-04 MED ORDER — SODIUM CHLORIDE 0.9 % IV SOLN
2.0000 g | INTRAVENOUS | Status: AC
  Administered 2020-12-04: 2 g via INTRAVENOUS
  Filled 2020-12-04: qty 2

## 2020-12-04 MED ORDER — PHENYLEPHRINE 40 MCG/ML (10ML) SYRINGE FOR IV PUSH (FOR BLOOD PRESSURE SUPPORT)
PREFILLED_SYRINGE | INTRAVENOUS | Status: AC
  Filled 2020-12-04: qty 10

## 2020-12-04 MED ORDER — DEXAMETHASONE SODIUM PHOSPHATE 10 MG/ML IJ SOLN
INTRAMUSCULAR | Status: DC | PRN
  Administered 2020-12-04: 5 mg via INTRAVENOUS

## 2020-12-04 MED ORDER — LIDOCAINE 2% (20 MG/ML) 5 ML SYRINGE
INTRAMUSCULAR | Status: DC | PRN
  Administered 2020-12-04: 60 mg via INTRAVENOUS

## 2020-12-04 MED ORDER — FENTANYL CITRATE (PF) 250 MCG/5ML IJ SOLN
INTRAMUSCULAR | Status: AC
  Filled 2020-12-04: qty 5

## 2020-12-04 MED ORDER — SUCCINYLCHOLINE CHLORIDE 200 MG/10ML IV SOSY
PREFILLED_SYRINGE | INTRAVENOUS | Status: DC | PRN
  Administered 2020-12-04: 100 mg via INTRAVENOUS

## 2020-12-04 MED ORDER — LACTATED RINGERS IV SOLN: INTRAVENOUS | Status: DC

## 2020-12-04 MED ORDER — PHENYLEPHRINE HCL-NACL 10-0.9 MG/250ML-% IV SOLN
INTRAVENOUS | Status: DC | PRN
  Administered 2020-12-04: 25 ug/min via INTRAVENOUS

## 2020-12-04 MED ORDER — FENTANYL CITRATE (PF) 100 MCG/2ML IJ SOLN
25.0000 ug | INTRAMUSCULAR | Status: DC | PRN
  Administered 2020-12-04 (×2): 25 ug via INTRAVENOUS

## 2020-12-04 MED ORDER — ONDANSETRON HCL 4 MG/2ML IJ SOLN
INTRAMUSCULAR | Status: AC
  Filled 2020-12-04: qty 2

## 2020-12-04 MED ORDER — SODIUM CHLORIDE 0.9 % IV SOLN
2.0000 g | Freq: Once | INTRAVENOUS | Status: AC
  Administered 2020-12-04: 2 g via INTRAVENOUS
  Filled 2020-12-04: qty 2

## 2020-12-04 MED ORDER — 0.9 % SODIUM CHLORIDE (POUR BTL) OPTIME
TOPICAL | Status: DC | PRN
  Administered 2020-12-04: 1000 mL

## 2020-12-04 MED ORDER — KETAMINE HCL 10 MG/ML IJ SOLN
INTRAMUSCULAR | Status: DC | PRN
  Administered 2020-12-04: 30 mg via INTRAVENOUS

## 2020-12-04 MED ORDER — FLEET ENEMA 7-19 GM/118ML RE ENEM: 1.0000 | ENEMA | Freq: Once | RECTAL | Status: DC

## 2020-12-04 MED ORDER — ALBUTEROL SULFATE HFA 108 (90 BASE) MCG/ACT IN AERS
2.0000 | INHALATION_SPRAY | Freq: Four times a day (QID) | RESPIRATORY_TRACT | Status: DC | PRN
  Administered 2020-12-04: 2 via RESPIRATORY_TRACT

## 2020-12-04 MED ORDER — MIDAZOLAM HCL 5 MG/5ML IJ SOLN
INTRAMUSCULAR | Status: DC | PRN
  Administered 2020-12-04: 2 mg via INTRAVENOUS

## 2020-12-04 MED ORDER — MOMETASONE FURO-FORMOTEROL FUM 200-5 MCG/ACT IN AERO
2.0000 | INHALATION_SPRAY | Freq: Two times a day (BID) | RESPIRATORY_TRACT | Status: DC
  Administered 2020-12-05 – 2020-12-06 (×3): 2 via RESPIRATORY_TRACT
  Filled 2020-12-04: qty 8.8

## 2020-12-04 MED ORDER — ROCURONIUM BROMIDE 10 MG/ML (PF) SYRINGE
PREFILLED_SYRINGE | INTRAVENOUS | Status: AC
  Filled 2020-12-04: qty 10

## 2020-12-04 MED ORDER — PROPOFOL 10 MG/ML IV BOLUS
INTRAVENOUS | Status: DC | PRN
  Administered 2020-12-04: 200 mg via INTRAVENOUS

## 2020-12-04 MED ORDER — FENTANYL CITRATE (PF) 100 MCG/2ML IJ SOLN
INTRAMUSCULAR | Status: AC
  Filled 2020-12-04: qty 2

## 2020-12-04 MED ORDER — GABAPENTIN 300 MG PO CAPS
300.0000 mg | ORAL_CAPSULE | ORAL | Status: AC
  Administered 2020-12-04: 300 mg via ORAL
  Filled 2020-12-04: qty 1

## 2020-12-04 MED ORDER — LABETALOL HCL 5 MG/ML IV SOLN
INTRAVENOUS | Status: AC
  Filled 2020-12-04: qty 8

## 2020-12-04 MED ORDER — CHLORHEXIDINE GLUCONATE 0.12 % MT SOLN
15.0000 mL | Freq: Once | OROMUCOSAL | Status: AC
  Administered 2020-12-04: 15 mL via OROMUCOSAL

## 2020-12-04 MED ORDER — PROPOFOL 10 MG/ML IV BOLUS
INTRAVENOUS | Status: AC
  Filled 2020-12-04: qty 20

## 2020-12-04 MED ORDER — ALBUTEROL SULFATE HFA 108 (90 BASE) MCG/ACT IN AERS
INHALATION_SPRAY | RESPIRATORY_TRACT | Status: AC
  Filled 2020-12-04: qty 6.7

## 2020-12-04 MED ORDER — CHLORHEXIDINE GLUCONATE CLOTH 2 % EX PADS: 6.0000 | MEDICATED_PAD | Freq: Once | CUTANEOUS | Status: DC

## 2020-12-04 MED ORDER — SCOPOLAMINE 1 MG/3DAYS TD PT72
1.0000 | MEDICATED_PATCH | TRANSDERMAL | Status: DC
  Administered 2020-12-04: 1.5 mg via TRANSDERMAL
  Filled 2020-12-04: qty 1

## 2020-12-04 MED ORDER — BUPIVACAINE-EPINEPHRINE 0.25% -1:200000 IJ SOLN
INTRAMUSCULAR | Status: DC | PRN
  Administered 2020-12-04: 30 mL

## 2020-12-04 MED ORDER — LACTATED RINGERS IR SOLN
Status: DC | PRN
  Administered 2020-12-04: 1000 mL

## 2020-12-04 MED ORDER — ONDANSETRON HCL 4 MG/2ML IJ SOLN
INTRAMUSCULAR | Status: DC | PRN
  Administered 2020-12-04: 4 mg via INTRAVENOUS

## 2020-12-04 MED ORDER — ACETAMINOPHEN 500 MG PO TABS
1000.0000 mg | ORAL_TABLET | Freq: Once | ORAL | Status: AC
  Administered 2020-12-04: 1000 mg via ORAL
  Filled 2020-12-04: qty 2

## 2020-12-04 MED ORDER — KETAMINE HCL 10 MG/ML IJ SOLN
INTRAMUSCULAR | Status: AC
  Filled 2020-12-04: qty 1

## 2020-12-04 MED ORDER — SUCCINYLCHOLINE CHLORIDE 200 MG/10ML IV SOSY
PREFILLED_SYRINGE | INTRAVENOUS | Status: AC
  Filled 2020-12-04: qty 10

## 2020-12-04 MED ORDER — ORAL CARE MOUTH RINSE: 15.0000 mL | Freq: Once | OROMUCOSAL | Status: AC

## 2020-12-04 MED ORDER — SUGAMMADEX SODIUM 200 MG/2ML IV SOLN
INTRAVENOUS | Status: DC | PRN
  Administered 2020-12-04: 200 mg via INTRAVENOUS

## 2020-12-04 MED ORDER — KCL IN DEXTROSE-NACL 20-5-0.45 MEQ/L-%-% IV SOLN
INTRAVENOUS | Status: DC
  Filled 2020-12-04 (×4): qty 1000

## 2020-12-04 MED ORDER — PHENYLEPHRINE 40 MCG/ML (10ML) SYRINGE FOR IV PUSH (FOR BLOOD PRESSURE SUPPORT)
PREFILLED_SYRINGE | INTRAVENOUS | Status: DC | PRN
Start: 2020-12-04 — End: 2020-12-04
  Administered 2020-12-04 (×2): 120 ug via INTRAVENOUS
  Administered 2020-12-04: 40 ug via INTRAVENOUS
  Administered 2020-12-04: 80 ug via INTRAVENOUS

## 2020-12-04 MED ORDER — FENTANYL CITRATE (PF) 100 MCG/2ML IJ SOLN
INTRAMUSCULAR | Status: DC | PRN
  Administered 2020-12-04: 50 ug via INTRAVENOUS
  Administered 2020-12-04: 100 ug via INTRAVENOUS
  Administered 2020-12-04 (×2): 50 ug via INTRAVENOUS
  Administered 2020-12-04 (×2): 100 ug via INTRAVENOUS

## 2020-12-04 MED ORDER — ALBUMIN HUMAN 5 % IV SOLN: INTRAVENOUS | Status: DC | PRN

## 2020-12-04 MED ORDER — KCL IN DEXTROSE-NACL 20-5-0.45 MEQ/L-%-% IV SOLN
INTRAVENOUS | Status: AC
  Filled 2020-12-04: qty 1000

## 2020-12-04 MED ORDER — PROMETHAZINE HCL 25 MG/ML IJ SOLN: 6.2500 mg | INTRAMUSCULAR | Status: DC | PRN

## 2020-12-04 MED ORDER — DEXAMETHASONE SODIUM PHOSPHATE 10 MG/ML IJ SOLN
INTRAMUSCULAR | Status: AC
  Filled 2020-12-04: qty 1

## 2020-12-04 SURGICAL SUPPLY — 60 items
APPLIER CLIP 5 13 M/L LIGAMAX5 (MISCELLANEOUS)
APPLIER CLIP ROT 13.4 12 LRG (CLIP)
BLADE SURG 15 STRL LF DISP TIS (BLADE) ×1 IMPLANT
BLADE SURG 15 STRL SS (BLADE) ×1
CLIP APPLIE 5 13 M/L LIGAMAX5 (MISCELLANEOUS) IMPLANT
CLIP APPLIE ROT 13.4 12 LRG (CLIP) IMPLANT
CLIP VESOLOCK LG 6/CT PURPLE (CLIP) IMPLANT
COVER SURGICAL LIGHT HANDLE (MISCELLANEOUS) ×2 IMPLANT
COVER WAND RF STERILE (DRAPES) IMPLANT
DECANTER SPIKE VIAL GLASS SM (MISCELLANEOUS) ×2 IMPLANT
DERMABOND ADVANCED (GAUZE/BANDAGES/DRESSINGS) ×1
DERMABOND ADVANCED .7 DNX12 (GAUZE/BANDAGES/DRESSINGS) ×1 IMPLANT
DEVICE SUT QUICK LOAD TK 5 (STAPLE) ×16 IMPLANT
DEVICE SUT TI-KNOT TK 5X26 (MISCELLANEOUS) ×2 IMPLANT
DEVICE SUTURE ENDOST 10MM (ENDOMECHANICALS) ×4 IMPLANT
DEVICE TROCAR PUNCTURE CLOSURE (ENDOMECHANICALS) IMPLANT
DISSECTOR BLUNT TIP ENDO 5MM (MISCELLANEOUS) ×2 IMPLANT
DRAIN PENROSE 0.5X18 (DRAIN) ×2 IMPLANT
DRAPE ARM DVNC X/XI (DISPOSABLE) ×4 IMPLANT
DRAPE COLUMN DVNC XI (DISPOSABLE) ×1 IMPLANT
DRAPE DA VINCI XI ARM (DISPOSABLE) ×4
DRAPE DA VINCI XI COLUMN (DISPOSABLE) ×1
DRAPE SHEET LG 3/4 BI-LAMINATE (DRAPES) IMPLANT
ELECT REM PT RETURN 15FT ADLT (MISCELLANEOUS) ×2 IMPLANT
ENDOLOOP SUT PDS II  0 18 (SUTURE)
ENDOLOOP SUT PDS II 0 18 (SUTURE) IMPLANT
GAUZE 4X4 16PLY RFD (DISPOSABLE) ×2 IMPLANT
GLOVE BIOGEL M 8.0 STRL (GLOVE) ×4 IMPLANT
GOWN STRL REUS W/TWL XL LVL3 (GOWN DISPOSABLE) ×8 IMPLANT
KIT BASIN OR (CUSTOM PROCEDURE TRAY) ×2 IMPLANT
KIT TURNOVER KIT A (KITS) ×2 IMPLANT
MARKER SKIN DUAL TIP RULER LAB (MISCELLANEOUS) ×2 IMPLANT
NEEDLE HYPO 22GX1.5 SAFETY (NEEDLE) ×2 IMPLANT
PACK CARDIOVASCULAR III (CUSTOM PROCEDURE TRAY) ×2 IMPLANT
PAD POSITIONING PINK XL (MISCELLANEOUS) ×2 IMPLANT
PENCIL SMOKE EVACUATOR (MISCELLANEOUS) IMPLANT
SCISSORS LAP 5X45 EPIX DISP (ENDOMECHANICALS) ×2 IMPLANT
SEALER VESSEL DA VINCI XI (MISCELLANEOUS) ×1
SEALER VESSEL EXT DVNC XI (MISCELLANEOUS) ×1 IMPLANT
SET BI-LUMEN FLTR TB AIRSEAL (TUBING) ×2 IMPLANT
SET IRRIG TUBING LAPAROSCOPIC (IRRIGATION / IRRIGATOR) ×2 IMPLANT
SOL ANTI FOG 6CC (MISCELLANEOUS) ×1 IMPLANT
SOLUTION ANTI FOG 6CC (MISCELLANEOUS) ×1
SOLUTION ELECTROLUBE (MISCELLANEOUS) ×2 IMPLANT
SUT ETHIBOND 0 36 GRN (SUTURE) IMPLANT
SUT ETHIBOND 2 0 SH (SUTURE)
SUT ETHIBOND 2 0 SH 36X2 (SUTURE) IMPLANT
SUT MNCRL AB 4-0 PS2 18 (SUTURE) ×6 IMPLANT
SUT SURGIDAC NAB ES-9 0 48 120 (SUTURE) ×18 IMPLANT
SUT VIC AB 4-0 SH 18 (SUTURE) ×2 IMPLANT
SUT VICRYL 0 UR6 27IN ABS (SUTURE) ×2 IMPLANT
SYR 20ML LL LF (SYRINGE) ×2 IMPLANT
TIP INNERVISION DETACH 56FR (MISCELLANEOUS) ×2 IMPLANT
TOWEL OR 17X26 10 PK STRL BLUE (TOWEL DISPOSABLE) ×2 IMPLANT
TOWEL OR NON WOVEN STRL DISP B (DISPOSABLE) ×2 IMPLANT
TRAY FOLEY MTR SLVR 16FR STAT (SET/KITS/TRAYS/PACK) ×2 IMPLANT
TROCAR ADV FIXATION 12X100MM (TROCAR) IMPLANT
TROCAR ADV FIXATION 5X100MM (TROCAR) IMPLANT
TROCAR BLADELESS OPT 5 100 (ENDOMECHANICALS) ×2 IMPLANT
TUBING CONNECTING 10 (TUBING) ×2 IMPLANT

## 2020-12-04 NOTE — Anesthesia Preprocedure Evaluation (Addendum)
Anesthesia Evaluation  Patient identified by MRN, date of birth, ID band Patient awake    Reviewed: Allergy & Precautions, NPO status , Patient's Chart, lab work & pertinent test results  Airway Mallampati: II  TM Distance: >3 FB Neck ROM: Full    Dental  (+) Dental Advisory Given   Pulmonary neg pulmonary ROS,    breath sounds clear to auscultation       Cardiovascular Exercise Tolerance: Good hypertension, Pt. on medications  Rhythm:Regular Rate:Normal     Neuro/Psych  Headaches,    GI/Hepatic Neg liver ROS, hiatal hernia, GERD  ,  Endo/Other  negative endocrine ROS  Renal/GU negative Renal ROS  negative genitourinary   Musculoskeletal   Abdominal   Peds negative pediatric ROS (+)  Hematology negative hematology ROS (+)   Anesthesia Other Findings   Reproductive/Obstetrics                            Anesthesia Physical Anesthesia Plan  ASA: II  Anesthesia Plan: General   Post-op Pain Management:    Induction: Rapid sequence and Intravenous  PONV Risk Score and Plan: 2 and Midazolam, Treatment may vary due to age or medical condition, Dexamethasone and Ondansetron  Airway Management Planned: Oral ETT  Additional Equipment:   Intra-op Plan:   Post-operative Plan: Extubation in OR  Informed Consent: I have reviewed the patients History and Physical, chart, labs and discussed the procedure including the risks, benefits and alternatives for the proposed anesthesia with the patient or authorized representative who has indicated his/her understanding and acceptance.     Dental advisory given  Plan Discussed with: Anesthesiologist, Surgeon and CRNA  Anesthesia Plan Comments:        Anesthesia Quick Evaluation

## 2020-12-04 NOTE — Op Note (Signed)
Raymond Watson  03-30-1971   12/04/2020    PCP:  Kirstie Peri, MD   Surgeon: [name], MD, FACS  Asst:  Gaynelle Adu, MD FACS and Carman Ching, MD FACS  Anes:  General  Preop Dx: Giant hiatal hernia with all of stomach in chest Postop Dx: Same, large type III mixed hiatal hernia  Procedure: Laparoscopic reduction of incarcerated hernia and removal of sac from the chest, upper endoscopy by Dr. Andrey Campanile to check anatomy, closure of the hiatus and Nissen fundoplication over a 56 lighted bougie Location Surgery: [facility] long OR two Complications: None noted  EBL:   Minimal cc  Drains: None  Description of Procedure:  The patient was taken to OR 2.  After anesthesia was administered and the patient was prepped  with chlorhexidine and a timeout was performed.  Access to the abdomen was achieved with a left upper quadrant Optiview 5 mm with a total of 6 trochars placed.  The wound to the right of the midline was a 12 and went and obliquely through the falciform ligament.  The dissection began along the right crus wire I took down the pars flaccida and then incised the crura and saw the wall the sac and began stripping out the sac.  I then carried this over anteriorly dividing all of this tissue that invest the esophagogastric junction.  On the left side we took out short gastrics to enable [place] to get the stomach which was not only herniated anteriorly but posteriorly and was very convoluted but once we took those down we then incised along the left crus and then stripped out this giant sac.  I subsequently debrided that from the stomach proper along with some of that was taken down on the right side.  There was a tremendous amount of foregut fat noted.  When the dissection was completed we had everything out of the chest.  I closed the hiatus posteriorly with 5 sutures using Endo Stitch and tie knots and Surgidek.  We did this and then passed a 56 lighted bougie by anesthesia and we did a 3 suture  Nissen fundoplication after doing a successful fusion and having a portion of the stomach that was not under tension and bring the wrap up affixing it to the esophagus and the stomach and again securing the Nissen fundoplication with tie knots.  Everything appeared to be in order.  No active bleeding or leaks were noted.  Blocks were placed laterally with Marcaine.  Abdomen was deflated and wounds were closed with 4-0 Monocryl and Dermabond  The patient tolerated the procedure well and was taken to the PACU in stable condition.     Matt B. [name], MD, [facility], [place] [phone]

## 2020-12-04 NOTE — Transfer of Care (Signed)
Immediate Anesthesia Transfer of Care Note  Patient: Raymond Watson  Procedure(s) Performed: LAPAROSCOPIC  HIATAL HERNIA REPAIR WITH FUNDOPLICATION (N/A Abdomen)  Patient Location: PACU  Anesthesia Type:General  Level of Consciousness: awake, alert , oriented and patient cooperative  Airway & Oxygen Therapy: Patient Spontanous Breathing and Patient connected to face mask oxygen  Post-op Assessment: Report given to RN and Post -op Vital signs reviewed and stable  Post vital signs: Reviewed and stable  Last Vitals:  Vitals Value Taken Time  BP 141/96 12/04/20 2246  Temp    Pulse 99 12/04/20 2254  Resp 20 12/04/20 2254  SpO2 99 % 12/04/20 2254  Vitals shown include unvalidated device data.  Last Pain:  Vitals:   12/04/20 1325  TempSrc: Oral         Complications: No complications documented.

## 2020-12-04 NOTE — Anesthesia Procedure Notes (Signed)
Procedure Name: Intubation Date/Time: 12/04/2020 6:42 PM Performed by: Raenette Rover, CRNA Pre-anesthesia Checklist: Patient identified, Emergency Drugs available, Suction available and Patient being monitored Patient Re-evaluated:Patient Re-evaluated prior to induction Oxygen Delivery Method: Circle system utilized Preoxygenation: Pre-oxygenation with 100% oxygen Induction Type: IV induction, Rapid sequence and Cricoid Pressure applied Laryngoscope Size: Mac and 3 Grade View: Grade I Tube type: Oral Tube size: 7.5 mm Number of attempts: 1 Airway Equipment and Method: Stylet Placement Confirmation: ETT inserted through vocal cords under direct vision,  positive ETCO2 and breath sounds checked- equal and bilateral Secured at: 22 cm Tube secured with: Tape Dental Injury: Teeth and Oropharynx as per pre-operative assessment

## 2020-12-04 NOTE — Interval H&P Note (Signed)
History and Physical Interval Note:  12/04/2020 5:49 PM  Raymond Watson  has presented today for surgery, with the diagnosis of type III mixed hiatal hernia.  The various methods of treatment have been discussed with the patient and family. After consideration of risks, benefits and other options for treatment, the patient has consented to  Procedure(s): XI ROBOTIC ASSISTED HIATAL HERNIA REPAIR WITH FUNDOPLICATION (N/A) as a surgical intervention.  The patient's history has been reviewed, patient examined, no change in status, stable for surgery.  I have reviewed the patient's chart and labs.  Questions were answered to the patient's satisfaction.     Valarie Merino

## 2020-12-04 NOTE — H&P (Signed)
Chief Complaint:  Large hiatal hernia   History of Present Illness:  Raymond Watson is an 50 y.o. male with a large hiatal hernia impacting his breathing.  Nocturnal reflux and vomiting.    Past Medical History:  Diagnosis Date  . GERD (gastroesophageal reflux disease)   . Headache   . Hyperlipidemia   . Hypertension    no meds  . Pneumonia 04/2020   due to Covid    Past Surgical History:  Procedure Laterality Date  . HERNIA REPAIR     age [age]    Current Facility-Administered Medications  Medication Dose Route Frequency Provider Last Rate Last Admin  . bupivacaine liposome (EXPAREL) 1.3 % injection 266 mg  20 mL Infiltration On Call to OR Luretha Murphy, MD      . cefoTEtan (CEFOTAN) 2 g in sodium chloride 0.9 % 100 mL IVPB  2 g Intravenous On Call to OR Luretha Murphy, MD      . Chlorhexidine Gluconate Cloth 2 % PADS 6 each  6 each Topical Once Luretha Murphy, MD      . lactated ringers infusion   Intravenous Continuous Elmer Picker, MD 10 mL/hr at 12/04/20 1541 Continued from Pre-op at 12/04/20 1541  . scopolamine (TRANSDERM-SCOP) 1 MG/3DAYS 1.5 mg  1 patch Transdermal On Call to OR Luretha Murphy, MD   1.5 mg at 12/04/20 1324   Patient has no known allergies. History reviewed. No pertinent family history. Social History:   reports that he has never smoked. He has never used smokeless tobacco. He reports previous drug use. He reports that he does not drink alcohol.   REVIEW OF SYSTEMS : Negative except for GERD with refux asthma  Physical Exam:   Blood pressure (!) 167/108, pulse 89, temperature 98.5 F (36.9 C), temperature source Oral, SpO2 98 %. There is no height or weight on file to calculate BMI.  Gen:  WDWN m NAD  Neurological: Alert and oriented to person, place, and time. Motor and sensory function is grossly intact  Head: Normocephalic and atraumatic.  Eyes: Conjunctivae are normal. Pupils are equal, round, and reactive to light. No scleral icterus.   Neck: Normal range of motion. Neck supple. No tracheal deviation or thyromegaly present.  Cardiovascular:  SR without murmurs or gallops.  No carotid bruits Breast:  Not examined Respiratory: Effort normal.  No respiratory distress. No chest wall tenderness. Breath sounds normal.  No wheezes, rales or rhonchi.  Abdomen:  nontender  GU:  Prior bilateral inguinal hernia as a child Musculoskeletal: Normal range of motion. Extremities are nontender. No cyanosis, edema or clubbing noted Lymphadenopathy: No cervical, preauricular, postauricular or axillary adenopathy is present Skin: Skin is warm and dry. No rash noted. No diaphoresis. No erythema. No pallor. Pscyh: Normal mood and affect. Behavior is normal. Judgment and thought content normal.   LABORATORY RESULTS: No results found for this or any previous visit (from the past 48 hour(s)).   RADIOLOGY RESULTS: No results found.  Problem List: Patient Active Problem List   Diagnosis Date Noted  . Large hiatal hernia 09/21/2020  . DOE (dyspnea on exertion) 07/24/2020  . 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 Chronic respiratory failure with hypoxia (HCC) 07/24/2020  . White coat syndrome with hypertension 07/24/2020    Assessment & Plan: Large type III hiatal hernia for lap repair.      Matt B. [name], MD, [facility] [phone] beeper [phone]  12/04/2020 5:47 PM

## 2020-12-05 ENCOUNTER — Encounter (HOSPITAL_COMMUNITY): Payer: Self-pay | Admitting: Surgery

## 2020-12-05 LAB — CBC
HCT: 42.4 % (ref 39.0–52.0)
Hemoglobin: 13.3 g/dL (ref 13.0–17.0)
MCH: 26.6 pg (ref 26.0–34.0)
MCHC: 31.4 g/dL (ref 30.0–36.0)
MCV: 84.8 fL (ref 80.0–100.0)
Platelets: 211 10*3/uL (ref 150–400)
RBC: 5 MIL/uL (ref 4.22–5.81)
RDW: 14.6 % (ref 11.5–15.5)
WBC: 9.7 10*3/uL (ref 4.0–10.5)
nRBC: 0 % (ref 0.0–0.2)

## 2020-12-05 LAB — BASIC METABOLIC PANEL
Anion gap: 4 — ABNORMAL LOW (ref 5–15)
BUN: 15 mg/dL (ref 6–20)
CO2: 27 mmol/L (ref 22–32)
Calcium: 8.8 mg/dL — ABNORMAL LOW (ref 8.9–10.3)
Chloride: 105 mmol/L (ref 98–111)
Creatinine, Ser: 1.48 mg/dL — ABNORMAL HIGH (ref 0.61–1.24)
GFR, Estimated: 57 mL/min — ABNORMAL LOW (ref >60)
Glucose, Bld: 135 mg/dL — ABNORMAL HIGH (ref 70–99)
Potassium: 5 mmol/L (ref 3.5–5.1)
Sodium: 136 mmol/L (ref 135–145)

## 2020-12-05 MED ORDER — KETOROLAC TROMETHAMINE 15 MG/ML IJ SOLN
INTRAMUSCULAR | Status: AC
  Filled 2020-12-05: qty 1

## 2020-12-05 MED ORDER — KETOROLAC TROMETHAMINE 15 MG/ML IJ SOLN
15.0000 mg | Freq: Once | INTRAMUSCULAR | Status: AC
  Administered 2020-12-05: 15 mg via INTRAVENOUS

## 2020-12-05 MED ORDER — KETOROLAC TROMETHAMINE 15 MG/ML IJ SOLN
15.0000 mg | Freq: Four times a day (QID) | INTRAMUSCULAR | Status: AC
  Administered 2020-12-05 – 2020-12-06 (×2): 15 mg via INTRAVENOUS
  Filled 2020-12-05 (×2): qty 1

## 2020-12-05 MED ORDER — FENTANYL CITRATE (PF) 100 MCG/2ML IJ SOLN
12.5000 ug | INTRAMUSCULAR | Status: DC | PRN
  Administered 2020-12-05 – 2020-12-06 (×15): 12.5 ug via INTRAVENOUS
  Filled 2020-12-05 (×15): qty 2

## 2020-12-05 MED ORDER — METOPROLOL TARTRATE 5 MG/5ML IV SOLN: 5.0000 mg | Freq: Four times a day (QID) | INTRAVENOUS | Status: DC | PRN

## 2020-12-05 MED ORDER — HYDROCODONE-ACETAMINOPHEN 5-325 MG PO TABS
1.0000 | ORAL_TABLET | ORAL | Status: DC | PRN
  Administered 2020-12-05 (×3): 2 via ORAL
  Administered 2020-12-05: 1 via ORAL
  Administered 2020-12-05 – 2020-12-06 (×4): 2 via ORAL
  Filled 2020-12-05 (×6): qty 2
  Filled 2020-12-05: qty 1
  Filled 2020-12-05: qty 2

## 2020-12-05 MED ORDER — PANTOPRAZOLE SODIUM 40 MG IV SOLR
40.0000 mg | Freq: Every day | INTRAVENOUS | Status: DC
  Administered 2020-12-05: 40 mg via INTRAVENOUS
  Filled 2020-12-05: qty 40

## 2020-12-05 MED ORDER — ONDANSETRON 4 MG PO TBDP: 4.0000 mg | ORAL_TABLET | Freq: Four times a day (QID) | ORAL | Status: DC | PRN

## 2020-12-05 MED ORDER — HEPARIN SODIUM (PORCINE) 5000 UNIT/ML IJ SOLN
5000.0000 [IU] | Freq: Three times a day (TID) | INTRAMUSCULAR | Status: DC
  Administered 2020-12-05 – 2020-12-06 (×4): 5000 [IU] via SUBCUTANEOUS
  Filled 2020-12-05 (×3): qty 1

## 2020-12-05 MED ORDER — ONDANSETRON HCL 4 MG/2ML IJ SOLN
4.0000 mg | Freq: Four times a day (QID) | INTRAMUSCULAR | Status: DC | PRN
  Administered 2020-12-06: 4 mg via INTRAVENOUS
  Filled 2020-12-05: qty 2

## 2020-12-05 NOTE — Progress Notes (Signed)
Patient ID: Raymond Watson, male   DOB: January 03, 1971, 50 y.o.   MRN: [id number] [facility] Progress Note:   1 Day Post-Op  Subjective: Mental status is clear.  Complaints pain. Objective: Vital signs in last 24 hours: Temp:  [97.8 F (36.6 C)-98.8 F (37.1 C)] 97.9 F (36.6 C) (05/10 1338) Pulse Rate:  [66-102] 66 (05/10 1338) Resp:  [16-23] 18 (05/10 1338) BP: (121-148)/(91-116) 139/101 (05/10 1338) SpO2:  [86 %-100 %] 96 % (05/10 1338)  Intake/Output from previous day: 05/09 0701 - 05/10 0700 In: 3618.3 [I.V.:2668.3; IV Piggyback:950] Out: 3210 [Urine:3110; Blood:100] Intake/Output this shift: Total I/O In: 118 [P.O.:118] Out: 600 [Urine:600]  Physical Exam: Work of breathing is not labored.  Incisional pain noted  Lab Results:  Results for orders placed or performed during the hospital encounter of 12/04/20 (from the past 48 hour(s))  Basic metabolic panel     Status: Abnormal   Collection Time: 12/05/20  5:46 AM  Result Value Ref Range   Sodium 136 135 - 145 mmol/L   Potassium 5.0 3.5 - 5.1 mmol/L   Chloride 105 98 - 111 mmol/L   CO2 27 22 - 32 mmol/L   Glucose, Bld 135 (H) 70 - 99 mg/dL    Comment: Glucose reference range applies only to samples taken after fasting for at least 8 hours.   BUN 15 6 - 20 mg/dL   Creatinine, Ser 6.78 (H) 0.61 - 1.24 mg/dL   Calcium 8.8 (L) 8.9 - 10.3 mg/dL   GFR, Estimated 57 (L) >60 mL/min    Comment: (NOTE) Calculated using the CKD-EPI Creatinine Equation (2021)    Anion gap 4 (L) 5 - 15    Comment: Performed at [facility], [address], [place], [place] [address]  CBC     Status: None   Collection Time: 12/05/20  5:46 AM  Result Value Ref Range   WBC 9.7 4.0 - 10.5 K/uL   RBC 5.00 4.22 - 5.81 MIL/uL   Hemoglobin 13.3 13.0 - 17.0 g/dL   HCT 17.5 10.2 - 58.5 %   MCV 84.8 80.0 - 100.0 fL   MCH 26.6 26.0 - 34.0 pg   MCHC 31.4 30.0 - 36.0 g/dL   RDW 27.7 82.4 - 23.5 %   Platelets 211 150 - 400  K/uL   nRBC 0.0 0.0 - 0.2 %    Comment: Performed at [facility], [address], [place], [place] [address]    Radiology/Results: No results found.  Anti-infectives: Anti-infectives (From admission, onward)    Start     Dose/Rate Route Frequency Ordered Stop   12/05/20 0100  cefoTEtan (CEFOTAN) 2 g in sodium chloride 0.9 % 100 mL IVPB        2 g 200 mL/hr over 30 Minutes Intravenous  Once 12/04/20 2314 12/04/20 2353   12/04/20 1315  cefoTEtan (CEFOTAN) 2 g in sodium chloride 0.9 % 100 mL IVPB        2 g 200 mL/hr over 30 Minutes Intravenous On call to O.R. 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   1 Day Post-Op    LOS: 1 day   Matt B. [name], MD, [facility] [phone] to reach the surgeon on call.    12/05/2020 6:51 PM

## 2020-12-06 LAB — CBC WITH DIFFERENTIAL/PLATELET
Abs Immature Granulocytes: 0.04 10*3/uL (ref 0.00–0.07)
Basophils Absolute: 0 10*3/uL (ref 0.0–0.1)
Basophils Relative: 0 %
Eosinophils Absolute: 0 10*3/uL (ref 0.0–0.5)
Eosinophils Relative: 0 %
HCT: 39 % (ref 39.0–52.0)
Hemoglobin: 12.3 g/dL — ABNORMAL LOW (ref 13.0–17.0)
Immature Granulocytes: 0 %
Lymphocytes Relative: 26 %
Lymphs Abs: 2.7 10*3/uL (ref 0.7–4.0)
MCH: 26.6 pg (ref 26.0–34.0)
MCHC: 31.5 g/dL (ref 30.0–36.0)
MCV: 84.2 fL (ref 80.0–100.0)
Monocytes Absolute: 1.1 10*3/uL — ABNORMAL HIGH (ref 0.1–1.0)
Monocytes Relative: 11 %
Neutro Abs: 6.5 10*3/uL (ref 1.7–7.7)
Neutrophils Relative %: 63 %
Platelets: 187 10*3/uL (ref 150–400)
RBC: 4.63 MIL/uL (ref 4.22–5.81)
RDW: 14.8 % (ref 11.5–15.5)
WBC: 10.3 10*3/uL (ref 4.0–10.5)
nRBC: 0 % (ref 0.0–0.2)

## 2020-12-06 MED ORDER — HYDROCODONE-ACETAMINOPHEN 5-325 MG PO TABS: 1.0000 | ORAL_TABLET | ORAL | 0 refills | Status: DC | PRN

## 2020-12-06 NOTE — Progress Notes (Signed)
Pt discharged home in stable condition. Discharge instructions given. Script sent to pharmacy of choice. No immediate questions or concerns at this time. Pt discharged from unit via wheelchair.  

## 2020-12-06 NOTE — Discharge Instructions (Signed)
Eating Plan After Nissen Fundoplication After a Nissen fundoplication procedure, it is common to have some difficulty swallowing. The part of your body that moves food and liquid from your mouth to your stomach (esophagus) will be swollen and may feel tight. It will take several weeks or months for your esophagus and stomach to heal. By following a special eating plan, you can prevent problems such as pain, swelling or pressure in the abdomen (bloating), gas, nausea, or diarrhea. What are tips for following this plan? Cooking  Cook all foods until they are soft.  Remove skins and seeds from fruits and vegetables before eating.  Remove skin and gristle from meats. Grind or finely mince meats before eating.  Avoid over-cooking meat. Dry, tough meat is more difficult to swallow.  Avoid using oil when cooking, or use only a small amount of oil.  Avoid using seasoning when cooking, or use only a small amount of seasoning.  Toast bread before eating. This makes it easier to swallow. Meal planning  Eat 6-8 small meals throughout the day.  Right after the surgery, have a few meals that are only clear liquids. Clear liquids include: ? Water. ? Clear fruit juice, no pulp. ? Chicken, beef, or vegetable broth. ? Gelatin. ? Decaffeinated tea or coffee without milk. ? Popsicles or shaved ice.  Depending on your progress, you may move to a full liquid diet as told by your health care provider. This includes clear liquids and the following: ? Dairy and alternative milks, such as soy milk. ? Strained creamed soups. ? Ice cream or sherbet. ? Pudding. ? Nutritional supplement drinks. ? Yogurt.  A few days after surgery, you may be able to start eating a diet of soft foods. You may need to eat according to this plan for several weeks.  Do not eat sweets or sweetened drinks at the beginning of a meal. Doing that may cause your stomach to empty faster than it should (dumping syndrome).    Lifestyle  Always sit upright when eating or drinking.  Eat slowly. Take small bites and chew food well before swallowing.  Do not lie down after eating. Stay sitting up for 30 minutes or longer after each meal.  Sip fluids between meals.  Limit how much you drink at one time. With meals and snacks, have 4-8 oz (120-240 mL). This is equal to  cup-1 cup.  Do not mix solid foods and liquids in the same mouthful.  Drink enough fluid to keep your urine pale yellow.  Do not chew gum or drink fluids through a straw. Doing those things may cause you to swallow extra air. General information  Do not drink carbonated drinks or alcohol.  Avoid foods and drinks that contain caffeine and chocolate.  Avoid foods and drinks that contain citrus or tomato.  Allow hot soups and drinks to cool before eating.  Avoid foods that cause gas, such as beans, peas, broccoli, or cabbage.  If dairy milk products cause diarrhea, avoid them or eat them in small amounts. Recommended foods Fruits Any soft-cooked fruits after skins and seeds are removed. Fruit juice. Vegetables Any soft-cooked vegetables after skins and seeds are removed. Vegetable juice. Grains  Cooked cereals. Dry cereals softened with liquid. Cooked pasta, rice, or other grains. Toasted bread. Bland crackers, such as soda or graham crackers. Meats and other protein foods  Tender cuts of meat, poultry, or fish after bones, skin, and gristle are removed. Poached, boiled, or scrambled eggs. Canned fish. Tofu.   Creamy nut butters. Dairy  Milk. Yogurt. Cottage cheese. Mild cheeses. Beverages  Nutritional supplement drinks. Decaffeinated tea or coffee. Sports drinks. Fats and oils  Butter. Margarine. Mayonnaise. Vegetable oil. Smooth salad dressing. Sweets and desserts  Plain hard candy. Marshmallows. Pudding. Ice cream. Gelatin. Sherbet. Seasoning and other foods  Salt. Light seasonings. Mustard. Vinegar. The items listed  above may not be a complete list of recommended foods and beverages. Contact a dietitian for more information. Foods to avoid Fruits Oranges. Grapefruit. Lemons. Limes. Citrus juices. Dried fruit. Crunchy, raw fruits. Vegetables Tomato sauce. Tomato juice. Broccoli. Cauliflower. Cabbage. Brussels sprouts. Crunchy, raw vegetables. Grains  High-fiber or bran cereal. Cereal with nuts, dried fruit, or coconut. Sweet breads, rolls, coffee cake, or donuts. Chewy or crusty breads. Popcorn. Meats and other protein foods  Beans, peas, and lentils. Tough or fatty meats. Fried meats, chicken, or fish. Fried eggs. Nuts and seeds. Crunchy nut butters. Dairy  Chocolate milk. Yogurt with chunks of fruit, nuts, seeds, or coconut. Strong cheeses. Beverages  Carbonated soft drinks. Alcohol. Cocoa. Hot drinks. Fats and oils  Bacon fat. Lard. Sweets and desserts  Chocolate. Candy with nuts, coconut, or seeds. Peppermint. Cookies. Cakes. Pie crust. Seasoning and other foods  Heavy seasonings. Chili sauce. Ketchup. Barbecue sauce. Pickles. Horseradish. The items listed above may not be a complete list of foods and beverages to avoid. Contact a dietitian for more information. Summary  Following this eating plan after a Nissen fundoplication is an important part of healing after surgery.  After surgery, you will start with a clear liquid diet before you progress to full liquids and soft foods. You may need to eat soft foods for several weeks.  Avoid eating foods that cause irritation, gas, nausea, diarrhea, or swelling or pressure in the abdomen (bloating), and avoid foods that are difficult to swallow.  Talk with a dietitian about which dietary choices are best for you. This information is not intended to replace advice given to you by your health care provider. Make sure you discuss any questions you have with your health care provider. Document Revised: 01/30/2020 Document Reviewed:  01/30/2020 Elsevier Patient Education  2021 Elsevier Inc.  

## 2020-12-06 NOTE — Discharge Summary (Signed)
Physician Discharge Summary  Patient ID: Raymond Watson MRN: [id number] DOB/AGE: Jun 29, 1971 50 y.o.  PCP: Kirstie Peri, MD  Admit date: 12/04/2020 Discharge date: 12/06/2020  Admission Diagnoses:  Stomach in chest with pulmonary compromise  Discharge Diagnoses:  same  Active Problems:   History of repair of hiatal hernia   Surgery:  Laparoscopic takedown of entire stomach in chest with diaphragm repair and Nissen fundoplication  Discharged Condition: improved  Hospital Course:   Had surgery Monday night and didn't complete until 10 pm.  Pain and slow PO on Tuesday.  Ready for discharge on Wednesday  Consults: none  Significant Diagnostic Studies: none    Discharge Exam: Blood pressure (!) 136/107, pulse 68, temperature 98 F (36.7 C), temperature source Oral, resp. rate 17, SpO2 97 %. Incisions OK  Disposition: Discharge disposition: 01-Home or Self Care       Discharge Instructions    Call MD for:  redness, tenderness, or signs of infection (pain, swelling, redness, odor or green/yellow discharge around incision site)   Complete by: As directed    Discharge instructions   Complete by: As directed    Full liquids for 1 week and then pureed foods for 3 weeks.   Increase activity slowly   Complete by: As directed      Allergies as of 12/06/2020   No Known Allergies     Medication List    TAKE these medications   albuterol 108 (90 Base) MCG/ACT inhaler Commonly known as: VENTOLIN HFA Inhale 2 puffs into the lungs every 6 (six) hours as needed for wheezing or shortness of breath.   amLODipine 2.5 MG tablet Commonly known as: NORVASC Take 2.5 mg by mouth daily.   cholecalciferol 25 MCG (1000 UNIT) tablet Commonly known as: VITAMIN D Take 1,000 Units by mouth in the morning.   dexlansoprazole 60 MG capsule Commonly known as: DEXILANT Take 60 mg by mouth at bedtime.   Fish Oil 500 MG Caps Take 500 mg by mouth in the morning.   Goodys Extra Strength  520-260-32.5 MG Pack Generic drug: Aspirin-Acetaminophen-Caffeine Take 1 packet by mouth daily as needed (headaches).   HYDROcodone-acetaminophen 5-325 MG tablet Commonly known as: NORCO/VICODIN Take 1-2 tablets by mouth every 4 (four) hours as needed for moderate pain.   multivitamin with minerals Tabs tablet Take 1 tablet by mouth in the morning. One-A-Day for Men 50+   rosuvastatin 5 MG tablet Commonly known as: CRESTOR Take 5 mg by mouth at bedtime.   Symbicort 160-4.5 MCG/ACT inhaler Generic drug: budesonide-formoterol Inhale 2 puffs into the lungs in the morning and at bedtime.   Zinc 50 MG Tabs Take 50 mg by mouth in the morning.       Follow-up Information    Luretha Murphy, MD Follow up in 3 week(s).   Specialty: General Surgery Contact information: [address] [place] [address] [phone]               Signed: Valarie Merino 12/06/2020, 12:40 PM

## 2020-12-08 NOTE — Anesthesia Postprocedure Evaluation (Signed)
Anesthesia Post Note  Patient: Raymond Watson  Procedure(s) Performed: LAPAROSCOPIC  HIATAL HERNIA REPAIR WITH FUNDOPLICATION (N/A Abdomen)     Patient location during evaluation: PACU Anesthesia Type: General Level of consciousness: awake and alert Pain management: pain level controlled Vital Signs Assessment: post-procedure vital signs reviewed and stable Respiratory status: spontaneous breathing, nonlabored ventilation, respiratory function stable and patient connected to nasal cannula oxygen Cardiovascular status: blood pressure returned to baseline and stable Postop Assessment: no apparent nausea or vomiting Anesthetic complications: no   No complications documented.  Last Vitals:  Vitals:   12/06/20 0746 12/06/20 1307  BP:  (!) 145/114  Pulse:  83  Resp:    Temp:  36.9 C  SpO2: 97% 98%    Last Pain:  Vitals:   12/06/20 1551  TempSrc:   PainSc: 7    Pain Goal: Patients Stated Pain Goal: 4 (12/06/20 0507)                 [name]

## 2020-12-27 ENCOUNTER — Ambulatory Visit (HOSPITAL_COMMUNITY)
Admission: RE | Admit: 2020-12-27 | Discharge: 2020-12-27 | Disposition: A | Discharge status: Home / Self Care | Payer: BC Managed Care – PPO | Source: Ambulatory Visit | Attending: Internal Medicine | Admitting: Internal Medicine

## 2020-12-27 DIAGNOSIS — J479 Bronchiectasis, uncomplicated: Secondary | ICD-10-CM | POA: Diagnosis not present

## 2020-12-29 ENCOUNTER — Other Ambulatory Visit: Payer: Self-pay | Admitting: Internal Medicine

## 2021-02-08 ENCOUNTER — Encounter: Payer: Self-pay | Admitting: Internal Medicine

## 2021-02-08 ENCOUNTER — Ambulatory Visit (INDEPENDENT_AMBULATORY_CARE_PROVIDER_SITE_OTHER): Payer: BC Managed Care – PPO | Admitting: Internal Medicine

## 2021-02-08 ENCOUNTER — Other Ambulatory Visit: Payer: Self-pay

## 2021-02-08 DIAGNOSIS — R0600 Dyspnea, unspecified: Secondary | ICD-10-CM

## 2021-02-08 DIAGNOSIS — J9611 Chronic respiratory failure with hypoxia: Secondary | ICD-10-CM | POA: Diagnosis not present

## 2021-02-08 DIAGNOSIS — R0609 Other forms of dyspnea: Secondary | ICD-10-CM

## 2021-02-08 DIAGNOSIS — J479 Bronchiectasis, uncomplicated: Secondary | ICD-10-CM

## 2021-02-08 MED ORDER — BUDESONIDE-FORMOTEROL FUMARATE 80-4.5 MCG/ACT IN AERO: INHALATION_SPRAY | RESPIRATORY_TRACT | 12 refills | Status: DC

## 2021-02-08 NOTE — Assessment & Plan Note (Addendum)
Onset Oct 2021 with covid -   07/24/2020   Walked RA  approx   600 ft  @ fast pace  stopped due to  End of study s sob and sats 96%  - as of 09/21/2020  Just using 02 1lpm hs  - d/c 02/08/2021   Ok to resume full time work, no restrictions  F/u in 3 months but sooner if needed

## 2021-02-08 NOTE — Progress Notes (Signed)
Raymond Watson, male    DOB: 12-Jan-1971  MRN: [id number]   Brief patient profile:  50   yo male never smoker/ never vaccinated  with h/o gerd and hyperlipidemia then acutely ill around Apr 28 2020   [facility] Internal Medicine Discharge Summary    Admit date: 04/29/2020 Discharge date: 05/10/2020 Length of stay: LOS: 11 days   Discharge Service: [facility] Internal Medicine Discharge Attending Physician: Raymond Watson Discharge to: To Home Condition at Discharge: fair Code Status: Full Code  Patient Care Team: Raymond Pilar, MD as PCP - General (Internal Medicine)  Consults   none  Discharge Diagnoses  Principal Problem: Pneumonia due to COVID-19 virus Active Problems: Hypoxia Diarrhea Hyperlipemia AKI (acute kidney injury) (CMS-HCC) Hiatal hernia Acute respiratory failure with hypoxia (CMS-HCC)    Hospital Course  Hello hello 50 year old gentleman 50 with history of hypertension comes with shortness of breath noted to have pneumonia associated with COVID-19 virus patient was admitted to hospital treated with antibiotics remdesivir and anticoagulation and followed closely management he slowly started to improve his oxygen level was better and lately at 4 L nasal cannula he was stable activity was increased and was discharged on medications noted below with a plan to follow him up in 1 week.      History of Present Illness  07/24/2020  Pulmonary/ 1st office eval/ Raymond Watson / [place] Office  Chief Complaint  Patient presents with   Consult    No complaints currently, currently on 2L O2  Dyspnea:  Mailbox is downhill from house and can back up hill to house s stopping at slower than usual pace but sats so far above 90% s  02  Cough: much better  Sleep: sleeps on pillows at about 30 degrees  SABA use: symbicort 160 2bid / twice albuterol  02 2lpm concentrator at hs and 24/7. rx Plan A = Automatic = Always=    Symbicort 160 Take 1- 2 puffs first thing in am and then another 1-2  puffs about 12 hours later.  Work on inhaler technique:   Plan B = Backup (to supplement plan A, not to replace it) Only use your albuterol inhaler as a rescue medication  Ok to try albuterol (on alternate days) 15 min before an activity that you know would make you short of breath and see if it makes any difference and if makes none then don't take it after activity unless you can't catch your breath. Make sure you check your oxygen saturation  at your highest level of activity  to be sure it stays over 90% and adjust  02 flow upward to maintain this level if needed but remember to turn it back to previous settings when you stop (to conserve your supply).  Continue 2lpm at bedtime and the rest of the day only if needed    08/08/2020  f/u ov/Raymond Watson office/Raymond Watson re: post covid doe / bronchiectasis on ct on symbicort 160 2 in am / 1 in pm Chief Complaint  Patient presents with   Follow-up    Breathing is unchanged since the last visit. He has only used his albuterol x 1 since his last visit 07/24/20. He states using o2 with sleep but not during the day since sats have stayed above 90% ra.   Dyspnea:  Mailbox and back better sats 94% s cp on exr  Cough: better / min mucoid Sleeping: flat bed, bunch of pillows SABA use: rarely 02: 2lph hs  Chest discomfort L of sternum /  never noct  / never pleuritic or ex  Can't do steps due to sob and job in [employer]  requires lifting/ steps rec Work on inhaler technique:    To get the most out of exercise, you need to be continuously aware that you are short of breath, but never out of breath, for 30 minutes twice daily.   Make sure you check your oxygen saturation at your highest level of activity to be sure it stays over 90% and keep track of it at least once a week, more often if breathing getting worse, and let me know if losing ground.   I very strongly recommend you get the moderna or pfizer vaccine   GERD  Diet  Please remember to go to the lab  department @ [facility] for your tests - we will call you with the results when they are available.          09/21/2020  f/u ov/[place] office/Raymond Watson re: pos covid 04/2020 Chief Complaint  Patient presents with   Follow-up    PFT done 09/19/20. Breathing is improved since the last visit. No new co's. He has not had to use his rescue inhaler.   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   Make sure you check your oxygen saturation at your highest level of activity to be sure it stays over 90%   I will be referring you to Dr Raymond Watson re: your large Hiatal Hernia  Please schedule a follow up office visit in 6 weeks, call sooner if needed    11/13/2020  f/u ov/[place] office/Raymond Watson re: doe p covid/ bronchiectasis with minimal airflow obst Chief Complaint  Patient presents with   Follow-up    Breathing has improved since the last visit and no new co's today. He rarely uses his albuterol.   Dyspnea: walking to farm x one mile there and back 95%  Twice daily / steps x 5 landing  Cough: none  Sleeping: fine resp wise  SABA use: just symbicort  02: none  Covid status: never vaccinated  Lung cancer screening: never  Smoker Cannot return for light duty and has not been paid since Feb 22nd  Rec To get the most out of exercise, you need to be continuously aware that you are short of breath, but never out of breath, for at least 30 minutes at least twice daily.   We will cancel your oxygen   Please schedule a follow up visit in 3  months but call sooner if needed  with all medications /inhalers/ solutions in hand    12/04/20  Lap Hernia repair Raymond Watson Deutscher)   02/08/2021  f/u ov/[place] office/Jalan Fariss re: doe p covid/ bronchiectasis with minimal airflow obst Chief Complaint  Patient presents with   Follow-up    Patient is feeling good overall, no concerns at this time.  Dyspnea:  walking 30 plus min at , almost flat sats remain  97% thuout per pt  Cough: none  Sleeping:  able to lie flat/ 2 pillows SABA use: just symbicort 160 2bid  02: none / Covid status: never did get vax  - had delta in October  Lung cancer screening: never smoker    No obvious day to day or daytime variability or assoc excess/ purulent sputum or mucus plugs or hemoptysis or cp or chest tightness, subjective wheeze or overt sinus or hb symptoms.   Sleeping  without nocturnal  or early am exacerbation  of respiratory  c/o's or need for noct saba. Also denies any obvious fluctuation of symptoms with weather or environmental changes or other aggravating or alleviating factors except as outlined above   No unusual exposure hx or h/o childhood pna/ asthma or knowledge of premature birth.  Current Allergies, Complete Past Medical History, Past Surgical History, Family History, and Social History were reviewed in [name].  ROS  The following are not active complaints unless bolded Hoarseness, sore throat, dysphagia, dental problems, itching, sneezing,  nasal congestion or discharge of excess mucus or purulent secretions, ear ache,   fever, chills, sweats, unintended wt loss or wt gain, classically pleuritic or exertional cp,  orthopnea pnd or arm/hand swelling  or leg swelling, presyncope, palpitations, abdominal pain, anorexia, nausea, vomiting, diarrhea  or change in bowel habits or change in bladder habits, change in stools or change in urine, dysuria, hematuria,  rash, arthralgias, visual complaints, headache, numbness,  weakness or ataxia or problems with walking or coordination,  change in mood or  memory.        Current Meds  Medication Sig   albuterol (VENTOLIN HFA) 108 (90 Base) MCG/ACT inhaler Inhale 2 puffs into the lungs every 6 (six) hours as needed for wheezing or shortness of breath.   amLODipine (NORVASC) 2.5 MG tablet Take 2.5 mg by mouth daily.   Aspirin-Acetaminophen-Caffeine (GOODYS EXTRA STRENGTH) 520-260-32.5 MG PACK Take 1 packet by mouth daily as needed (headaches).   cholecalciferol (VITAMIN D) 25 MCG (1000 UNIT) tablet Take 1,000 Units by mouth in the morning.   dexlansoprazole (DEXILANT) 60 MG capsule Take 60 mg by mouth at bedtime.   Multiple Vitamin (MULTIVITAMIN WITH MINERALS) TABS tablet Take 1 tablet by mouth in the morning. One-A-Day for Men 50+   Omega-3 Fatty Acids (FISH OIL) 500 MG CAPS Take 500 mg by mouth in the morning.   rosuvastatin (CRESTOR) 5 MG tablet Take 5 mg by mouth at bedtime.   SYMBICORT 160-4.5 MCG/ACT inhaler Inhale 2 puffs into the lungs in the morning and at bedtime.   Zinc 50 MG TABS Take 50 mg by mouth in the morning.               Past Medical History:  Diagnosis Date   Hyperlipidemia    Pneumonia        Objective:     02/08/2021       143 11/13/2020       173  09/21/20 174 lb 12.8 oz (79.3 kg)  08/08/20 174 lb (78.9 kg)  07/24/20 171 lb 6.4 oz (77.7 kg)     Vital signs reviewed  02/08/2021  - Note at rest 02 sats  98% on RA   General appearance:    pleasant amb male   HEENT : pt wearing mask not removed for exam due to covid -19 concerns.    NECK :  without JVD/Nodes/TM/ nl carotid upstrokes bilaterally   LUNGS: no acc muscle use,  Nl contour chest which is clear to A and  P bilaterally without cough on insp or exp maneuvers   CV:  RRR  no s3 or murmur or increase in P2, and no edema   ABD:  soft and nontender with nl inspiratory excursion in the supine position. No bruits or organomegaly appreciated, bowel sounds nl  MS:  Nl  gait/ ext warm without deformities, calf tenderness, cyanosis or clubbing No obvious joint restrictions   SKIN: warm and dry without lesions    NEURO:  alert, approp, nl sensorium with  no motor or cerebellar deficits apparent.            I personally reviewed images and agree with radiology impression as follows:   Chest CT  12/27/20 1. The appearance of the lungs is most compatible with resolving post infectious or inflammatory fibrosis, categorized as most compatible with a alternative diagnosis (not usual interstitial pneumonia) per current ATS guidelines. 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 2. Small pulmonary nodules measuring 5 mm or less in size, stable compared to the prior examination, strongly favored to be benign. No follow-up needed if patient is Watson-risk   2017; Radiology 2017; [id number]. 3. Aortic atherosclerosis, in addition to left anterior descending coronary artery disease.   4. Large hiatal hernia with nearly completely intrathoracic stomach.    Assessment

## 2021-02-08 NOTE — Assessment & Plan Note (Signed)
Onset was Apr 28 2020 with covid  -  07/24/2020   Walked RA  approx   600 ft  @ fast pace  stopped due to  End of study s sob and sats 96% - PFT's  09/19/2020  FEV1 2.27 (69 % ) ratio 0.68  p 0 % improvement from saba p symbicort  prior to study with DLCO  24.89 (72%) corrects to 4.55 (106%)  for alv volume and FV curve nl   - 09/21/2020 )- walked moderate pace/2 additional laps = 500 ft- sat at end 96% ra and pulse 124/No SOB   rec reconditioning by sub max exercise min of 30 min daily

## 2021-02-08 NOTE — Assessment & Plan Note (Addendum)
CT chest 06/09/20 p covid (dx 04/29/20)  changes with tubular bronchiectasis both lower lobes -  Repeat CT = HRCT rec for 12/07/20 (placed in reminder file )  - 07/24/2020   Demonstrated approp hfa > use symbicort 160 1-2 bid and approp saba  - 08/08/2020  After extensive coaching inhaler device,  effectiveness =    75% from baseline 25%  - PFT's 09/19/20  No obstruction p symbicort in am   - HRCT 12/28/2020 1. 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 2. Small pulmonary nodules measuring 5 mm or less in size, stable compared to the prior examination, strongly favored to be benign. No follow-up needed if patient is low-risk (and has no known or suspected primary neoplasm).> never smoker, no f/u needed  Can step down now to symbiocort 80 1-2 bid Based on two studies from NEJM  378; 20 p 1865 (2018) and 380 : p2020-30 (2019) in pts with mild asthma it is reasonable to use low dose symbicort eg 80 2bid "prn" flare in this setting but I emphasized this was only shown with symbicort and takes advantage of the rapid onset of action but is not the same as "rescue therapy" but can be stopped once the acute symptoms have resolved and the need for rescue has been minimized (< 2 x weekly)    02/08/2021  After extensive coaching inhaler device,  effectiveness =    90%          Each maintenance medication was reviewed in detail including emphasizing most importantly the difference between maintenance and prns and under what circumstances the prns are to be triggered using an action plan format where appropriate.  Total time for H and P, chart review, counseling, reviewing hfa/02 device(s) and generating customized AVS unique to this office visit / same day charting  > 30 min

## 2021-02-08 NOTE — Patient Instructions (Addendum)
To get the most out of exercise, you need to be continuously aware that you are short of breath, but never out of breath, for at least 30 minutes daily. As you improve, it will actually be easier for you to do the same amount of exercise  in  30 minutes so always push to the level where you are short of breath.  Once you can do this, push for longer duration or repeat it after at least 4 hours of rest.    Decrease the symbicort 80 to Take 1- 2 puffs first thing in am and then another 2 puffs about 12 hours later.     Please schedule a follow up visit in 3 months but call sooner if needed

## 2021-05-21 ENCOUNTER — Telehealth: Payer: Self-pay | Admitting: Internal Medicine

## 2021-05-21 NOTE — Telephone Encounter (Signed)
I called and spoke with the wife and she voices understanding about Dr. Sherene Sires recommendations. Nothing further needed.

## 2021-05-21 NOTE — Telephone Encounter (Signed)
My records show his 52 was discontinued  because he was doing so much better so Will need to go to ER since 02 now dropping to make sure this is not pneumonia ( a complication of the flu)

## 2021-05-21 NOTE — Telephone Encounter (Signed)
Primary Pulmonologist: WERT Last office visit and with whom: 02/08/2021 Wert What do we see them for (pulmonary problems): DOE, Hypoxia  Last OV assessment/plan: See below   Was appointment offered to patient (explain)?  No    Reason for call: Patients room mate is flu +. Patient is now sick. Wife (per dpr) thinks he may have the flu. States that while sleeping he is at 87% O2 sat on room air. Coughing up clear mucus, headache, body aches, fever 101.0 last night. Today it is 100. Has taken alkaseltzer cold plus. Using inhalers as prescribed.  Does not oxygen at all. Patients wife wants to know if he should go to the ER since his O2 is dropping while he's sleeping or if Dr. Sherene Sires has recommendations for them.    No Known Allergies  Immunization History  Administered Date(s) Administered   Influenza-Unspecified 05/29/2020    Assessment                                   Assessment & Plan Note by Nyoka Cowden, MD at 02/08/2021 12:56 PM  Author: Nyoka Cowden, MD Author Type: Physician Filed: 02/08/2021 12:56 PM  Note Status: Written Cosign: Cosign Not Required Encounter Date: 02/08/2021  Problem: DOE (dyspnea on exertion)  Editor: Nyoka Cowden, MD (Physician)             Onset was Apr 28 2020 with covid  -  07/24/2020   Walked RA  approx   600 ft  @ fast pace  stopped due to  End of study s sob and sats 96% - PFT's  09/19/2020  FEV1 2.27 (69 % ) ratio 0.68  p 0 % improvement from saba p symbicort  prior to study with DLCO  24.89 (72%) corrects to 4.55 (106%)  for alv volume and FV curve nl   - 09/21/2020 )- walked moderate pace/2 additional laps = 500 ft- sat at end 96% ra and pulse 124/No SOB    rec reconditioning by sub max exercise min of 30 min daily         Assessment & Plan Note by Nyoka Cowden, MD at 02/08/2021 12:52 PM  Author: Nyoka Cowden, MD Author Type: Physician Filed: 02/08/2021 12:55 PM  Note Status: Carlisle Cater: Cosign Not Required Encounter  Date: 02/08/2021  Problem: Bronchiectasis, 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 Nyoka Cowden, MD (Physician) at 02/08/2021 12:54 PM - Edited   2. Nyoka Cowden, MD (Physician) at 02/08/2021 12:53 PM - Written  CT chest 06/09/20 p covid (dx 04/29/20)  changes with tubular bronchiectasis both lower lobes -  Repeat CT = HRCT rec for 12/07/20 (placed in reminder file )  - 07/24/2020   Demonstrated approp hfa > use symbicort 160 1-2 bid and approp saba  - 08/08/2020  After extensive coaching inhaler device,  effectiveness =    75% from baseline 25%  - PFT's 09/19/20  No obstruction p symbicort in am   - HRCT 12/28/2020 1. 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 2. Small pulmonary nodules measuring 5 mm or less in size, stable compared to the prior examination, strongly favored to be benign. No follow-up needed  if patient is low-risk (and has no known or suspected primary neoplasm).> never smoker, no f/u needed   Can step down now to symbiocort 80 1-2 bid Based on two studies from NEJM  378; 20 p 1865 (2018) and 380 : p2020-30 (2019) in pts with mild asthma it is reasonable to use low dose symbicort eg 80 2bid "prn" flare in this setting but I emphasized this was only shown with symbicort and takes advantage of the rapid onset of action but is not the same as "rescue therapy" but can be stopped once the acute symptoms have resolved and the need for rescue has been minimized (< 2 x weekly)     02/08/2021  After extensive coaching inhaler device,  effectiveness =    90%            Each maintenance medication was reviewed in detail including emphasizing most importantly the difference between maintenance and prns and under what circumstances the prns are to be triggered  using an action plan format where appropriate.   Total time for H and P, chart review, counseling, reviewing hfa/02 device(s) and generating customized AVS unique to this office visit / same day charting  > 30 min                 Assessment & Plan Note by Nyoka Cowden, MD at 02/08/2021 9:23 AM  Author: Nyoka Cowden, MD Author Type: Physician Filed: 02/08/2021 12:54 PM  Note Status: Carlisle Cater: Cosign Not Required Encounter Date: 02/08/2021  Problem: Chronic respiratory failure with hypoxia [facility])  Editor: Nyoka Cowden, MD (Physician)      Prior Versions: 1. Nyoka Cowden, MD (Physician) at 02/08/2021  9:23 AM - Written  Onset Oct 2021 with covid -   07/24/2020   Walked RA  approx   600 ft  @ fast pace  stopped due to  End of study s sob and sats 96%  - as of 09/21/2020  Just using 02 1lpm hs  - d/c 02/08/2021    Ok to resume full time work, no restrictions   F/u in 3 months but sooner if needed

## 2021-06-01 ENCOUNTER — Other Ambulatory Visit: Payer: Self-pay

## 2021-06-01 ENCOUNTER — Ambulatory Visit (INDEPENDENT_AMBULATORY_CARE_PROVIDER_SITE_OTHER): Payer: BC Managed Care – PPO | Admitting: Internal Medicine

## 2021-06-01 ENCOUNTER — Encounter: Payer: Self-pay | Admitting: Internal Medicine

## 2021-06-01 ENCOUNTER — Ambulatory Visit (HOSPITAL_COMMUNITY)
Admission: RE | Admit: 2021-06-01 | Discharge: 2021-06-01 | Disposition: A | Discharge status: Home / Self Care | Payer: BC Managed Care – PPO | Source: Ambulatory Visit | Attending: Internal Medicine | Admitting: Internal Medicine

## 2021-06-01 DIAGNOSIS — J479 Bronchiectasis, uncomplicated: Secondary | ICD-10-CM

## 2021-06-01 DIAGNOSIS — K449 Diaphragmatic hernia without obstruction or gangrene: Secondary | ICD-10-CM | POA: Diagnosis not present

## 2021-06-01 DIAGNOSIS — R0609 Other forms of dyspnea: Secondary | ICD-10-CM

## 2021-06-01 DIAGNOSIS — J9611 Chronic respiratory failure with hypoxia: Secondary | ICD-10-CM | POA: Diagnosis not present

## 2021-06-01 NOTE — Progress Notes (Signed)
Raymond Watson, male    DOB: 01-Oct-1970  MRN: [id number]   Brief patient profile:  50  yo black male never smoker/ never vaccinated  with h/o gerd and hyperlipidemia then acutely ill around Apr 28 2020   [facility] Discharge Summary    Admit date: 04/29/2020 Discharge date: 05/10/2020 Length of stay: LOS: 11 days    Discharge Diagnoses  Principal Problem: Pneumonia due to COVID-19 virus Active Problems: Hypoxia Diarrhea Hyperlipemia AKI (acute kidney injury) (CMS-HCC) Hiatal hernia Acute respiratory failure with hypoxia (CMS-HCC)    Raymond Watson hello 50 year old gentleman 44 with history of hypertension comes with shortness of breath noted to have pneumonia associated with COVID-19 virus patient was admitted to hospital treated with antibiotics remdesivir and anticoagulation and followed closely management he slowly started to improve his oxygen level was better and lately at 4 L nasal cannula he was stable activity was increased and was discharged on medications noted below with a plan to follow him up in 1 week.      History of Present Illness  07/24/2020  Pulmonary/ 1st office eval/ Raymond Watson / [facility]  Chief Complaint  Patient presents with   Consult    No complaints currently, currently on 2L O2  Dyspnea:  Mailbox is downhill from house and can back up hill to house s stopping at slower than usual pace but sats so far above 90% s  02  Cough: much better  Sleep: sleeps on pillows at about 30 degrees  SABA use: symbicort 160 2bid / twice albuterol  02 2lpm concentrator at hs and 24/7. rx Plan A = Automatic = Always=    Symbicort 160 Take 1- 2 puffs first thing in am and then another 1-2 puffs about 12 hours later.  Work on inhaler technique:   Plan B = Backup (to supplement plan A, not to replace it) Only use your albuterol inhaler as a rescue medication  Ok to try albuterol (on alternate days) 15 min before an activity that you know would  make you short of breath and see if it makes any difference and if makes none then don't take it after activity unless you can't catch your breath. Make sure you check your oxygen saturation  at your highest level of activity  to be sure it stays over 90% and adjust  02 flow upward to maintain this level if needed but remember to turn it back to previous settings when you stop (to conserve your supply).  Continue 2lpm at bedtime and the rest of the day only if needed    08/08/2020  f/u ov/[place] office/Raymond Watson re: post covid doe / bronchiectasis on ct on symbicort 160 2 in am / 1 in pm Chief Complaint  Patient presents with   Follow-up    Breathing is unchanged since the last visit. He has only used his albuterol x 1 since his last visit 07/24/20. He states using o2 with sleep but not during the day since sats have stayed above 90% ra.   Dyspnea:  Mailbox and back better sats 94% s cp on exr  Cough: better / min mucoid Sleeping: flat bed, bunch of pillows SABA use: rarely 02: 2lph hs  Chest discomfort L of sternum / never noct  / never pleuritic or ex  Can't do steps due to sob and job in [employer]  requires lifting/ steps rec Work on inhaler technique:    To get the most out of exercise, you need to  be continuously aware that you are short of breath, but never out of breath, for 30 minutes twice daily.   Make sure you check your oxygen saturation at your highest level of activity to be sure it stays over 90% and keep track of it at least once a week, more often if breathing getting worse, and let me know if losing ground.   I very strongly recommend you get the moderna or pfizer vaccine   GERD  Diet  Please remember to go to the lab department @ [facility] for your tests - we will call you with the results when they are available.          09/21/2020  f/u ov/[place] office/Raymond Watson re: pos covid 04/2020 Chief Complaint  Patient presents with   Follow-up    PFT done 09/19/20.  Breathing is improved since the last visit. No new co's. He has not had to use his rescue inhaler.   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   Make sure you check your oxygen saturation at your highest level of activity to be sure it stays over 90%   I will be referring you to Dr Kaylyn Lim re: your large Hiatal Hernia  Please schedule a follow up office visit in 6 weeks, call sooner if needed    11/13/2020  f/u ov/[place] office/Raymond Watson re: doe p covid/ bronchiectasis with minimal airflow obst Chief Complaint  Patient presents with   Follow-up    Breathing has improved since the last visit and no new co's today. He rarely uses his albuterol.   Dyspnea: walking to farm x one mile there and back 95%  Twice daily / steps x 5 landing  Cough: none  Sleeping: fine resp wise  SABA use: just symbicort  02: none  Covid status: never vaccinated  Lung cancer screening: never  Smoker Cannot return for light duty and has not been paid since Feb 22nd  Rec To get the most out of exercise, you need to be continuously aware that you are short of breath, but never out of breath, for at least 30 minutes at least twice daily.   We will cancel your oxygen   Please schedule a follow up visit in 3 months but call sooner if needed  with all medications /inhalers/ solutions in hand    12/04/20  Lap Hernia repair [facility] Done)   02/08/2021  f/u ov/[place] office/Raymond Watson re: doe p covid/ bronchiectasis with minimal airflow obst Chief Complaint  Patient  presents with   Follow-up    Patient is feeling good overall, no concerns at this time.  Dyspnea:  walking 30 plus min at 70mph, almost flat sats remain  97% thuout per pt  Cough: none  Sleeping: able to lie flat/ 2 pillows SABA use: just symbicort 160 2bid  02: none / Covid status: never did get vax  - had delta in October  Lung cancer screening: never smoker  Rec To get the most out  of exercise, you need to be continuously aware that you are short of breath, but never out of breath, for at least 30 minutes daily.  Decrease the symbicort 80 to Take 1- 2 puffs first thing in am and then another 2 puffs about 12 hours later.     06/01/2021  f/u ov/[place] office/Catalena Stanhope re: bronchiectasis with min obst maint on symbicort 80 one puff  bid  Chief Complaint  Patient presents with   Follow-up    10/24 flu+ Other than the flu no issues since last OV   Dyspnea:  Not limited by breathing from desired activities   Treadmill 5 x per week/ 30 min at / 0 grade and sats upper 90's RA Cough: none  Sleeping: flat bed / two pillow  SABA use: rarely  02: none since April 2022  Covid status: never vax  Still having midline chest /epigastric discomfort when bend over or eats chicken      No obvious day to day or daytime variability or assoc excess/ purulent sputum or mucus plugs or hemoptysis or  chest tightness, subjective wheeze or overt sinus or hb symptoms.   Sleeping  without nocturnal  or early am exacerbation  of respiratory  c/o's or need for noct saba. Also denies any obvious fluctuation of symptoms with weather or environmental changes or other aggravating or alleviating factors except as outlined above.  No unusual exposure hx or h/o childhood pna/ asthma or knowledge of premature birth.  Current Allergies, Complete Past Medical History, Past Surgical History, Family History, and Social History were reviewed in [employer] record.  ROS  The following are not  active complaints unless bolded Hoarseness, sore throat, dysphagia, dental problems, itching, sneezing,  nasal congestion or discharge of excess mucus or purulent secretions, ear ache,   fever, chills, sweats, unintended wt loss or wt gain, classically pleuritic or exertional cp,  orthopnea pnd or arm/hand swelling  or leg swelling, presyncope, palpitations, abdominal pain, anorexia, nausea, vomiting, diarrhea  or change in bowel habits or change in bladder habits, change in stools or change in urine, dysuria, hematuria,  rash, arthralgias, visual complaints, headache, numbness, weakness or ataxia or problems with walking or coordination,  change in mood or  memory.        Current Meds  Medication Sig   amLODipine (NORVASC) 2.5 MG tablet Take 2.5 mg by mouth daily.   Aspirin-Acetaminophen-Caffeine (GOODYS EXTRA STRENGTH) 520-260-32.5 MG PACK Take 1 packet by mouth daily as needed (headaches).   budesonide-formoterol (SYMBICORT) 80-4.5 MCG/ACT inhaler Take 2 puffs first thing in am and then another 2 puffs about 12 hours later.   cholecalciferol (VITAMIN D) 25 MCG (1000 UNIT) tablet Take 1,000 Units by mouth in the morning.   Multiple Vitamin (MULTIVITAMIN WITH MINERALS) TABS tablet Take 1 tablet by mouth in the morning. One-A-Day for Men 50+   Omega-3 Fatty Acids (FISH OIL) 500 MG CAPS Take 500 mg by mouth in the morning.   rosuvastatin (CRESTOR) 5 MG tablet Take 5 mg by mouth at bedtime.   Zinc 50 MG TABS Take 50 mg by mouth in the morning.                  Past Medical History:  Diagnosis Date   Hyperlipidemia    Pneumonia        Objective:    06/01/2021       143  02/08/2021       143 11/13/2020  173  09/21/20 174 lb 12.8 oz (79.3 kg)  08/08/20 174 lb (78.9 kg)  07/24/20 171 lb 6.4 oz (77.7 kg)       Vital signs reviewed  06/01/2021  - Note at rest 02 sats  98% on RA   General appearance:    pleasant amb bm nad   HEENT : pt wearing mask not removed for exam due to covid  -19 concerns.    NECK :  without JVD/Nodes/TM/ nl carotid upstrokes bilaterally   LUNGS: no acc muscle use,  Nl contour chest which is clear to A and P bilaterally without cough on insp or exp maneuvers   CV:  RRR  no s3 or murmur or increase in P2, and no edema   ABD:  soft minimal tenderness over epigastrium with nl inspiratory excursion in the supine position. No bruits or organomegaly appreciated, bowel sounds nl  MS:  Nl gait/ ext warm without deformities, calf tenderness, cyanosis or clubbing No obvious joint restrictions   SKIN: warm and dry without lesions    NEURO:  alert, approp, nl sensorium with  no motor or cerebellar deficits apparent.        CXR PA and Lateral:   06/01/2021 :    I personally reviewed images and agree with radiology impression as follows:    Negative for acute cardiopulmonary disease.  Hiatal hernia, similar to the comparison chest x-ray My review:  HH looks similar to preop appearance       Assessment

## 2021-06-01 NOTE — Patient Instructions (Addendum)
Plan A = Automatic = Always=    Symbicort 80 Take 1-2 puffs first thing in am and then another 1-2 puffs about 12 hours later.    Work on inhaler technique:  relax and gently blow all the way out then take a nice smooth full deep breath back in, triggering the inhaler at same time you start breathing in.  Hold for up to 5 seconds if you can. Blow out thru nose. Rinse and gargle with water when done.  If mouth or throat bother you at all,  try brushing teeth/gums/tongue with arm and hammer toothpaste/ make a slurry and gargle and spit out.       Plan B = Backup (to supplement plan A, not to replace it) Only use your albuterol inhaler as a rescue medication to be used if you can't catch your breath by resting or doing a relaxed purse lip breathing pattern.  - The less you use it, the better it will work when you need it. - Ok to use the inhaler up to 2 puffs  every 4 hours if you must but call for appointment if use goes up over your usual need - Don't leave home without it !!  (think of it like the spare tire for your car)    Please remember to go to the  x-ray department  @  [facility] for your tests - we will call you with the results when they are available     See Dr [name] about the new pains you have having over your lower chest and upper abdomen  that have developed since your surgery    Please schedule a follow up visit in 6 months but call sooner if needed

## 2021-06-01 NOTE — Assessment & Plan Note (Addendum)
CT chest 06/09/20 p covid (dx 04/29/20)  changes with tubular bronchiectasis both lower lobes -  Repeat CT = HRCT rec for 12/07/20 (placed in reminder file )  - 07/24/2020   Demonstrated approp hfa > use symbicort 160 1-2 bid and approp saba  - 08/08/2020  After extensive coaching inhaler device,  effectiveness =    75% from baseline 25%  - PFT's 09/19/20  No obstruction p symbicort in am   - HRCT 12/28/2020 1. 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 2. Small pulmonary nodules measuring 5 mm or less in size, stable compared to the prior examination, strongly favored to be benign. No follow-up needed if patient is low-risk (and has no known or suspected primary neoplasm).> never smoker, no f/u needed  Doing great on min doses of symbicort but prone apparently to flares with viral uri so rec increase symbiocrt to 80 2bid for flares with approp saba   06/01/2021  After extensive coaching inhaler device,  effectiveness =    80%   Well compensated on just symbicort 80 one bid with instructions to increase to 2 q 12 for any flare.

## 2021-06-02 ENCOUNTER — Encounter: Payer: Self-pay | Admitting: Internal Medicine

## 2021-06-02 NOTE — Assessment & Plan Note (Signed)
Onset Oct 2021 with covid -   07/24/2020   Walked RA  approx   600 ft  @ fast pace  stopped due to  End of study s sob and sats 96%  - as of 09/21/2020  Just using 02 1lpm hs  - d/c 02/08/2021   sats fine on treadmill/ nothing further needed   F/u q 6 m

## 2021-06-02 NOTE — Assessment & Plan Note (Signed)
Onset was Apr 28 2020 with covid  -  07/24/2020   Walked RA  approx   600 ft  @ fast pace  stopped due to  End of study s sob and sats 96% - PFT's  09/19/2020  FEV1 2.27 (69 % ) ratio 0.68  p 0 % improvement from saba p symbicort  prior to study with DLCO  24.89 (72%) corrects to 4.55 (106%)  for alv volume and FV curve nl   - 09/21/2020 )- walked moderate pace/2 additional laps = 500 ft- sat at end 96% ra and pulse 124/No SOB   Marked improvement / no further w/u planned

## 2021-06-14 ENCOUNTER — Other Ambulatory Visit: Payer: Self-pay | Admitting: Surgery

## 2021-06-14 DIAGNOSIS — K449 Diaphragmatic hernia without obstruction or gangrene: Secondary | ICD-10-CM

## 2021-06-18 ENCOUNTER — Ambulatory Visit
Admission: RE | Admit: 2021-06-18 | Discharge: 2021-06-18 | Disposition: A | Discharge status: Home / Self Care | Payer: BC Managed Care – PPO | Source: Ambulatory Visit | Attending: Surgery | Admitting: Surgery

## 2021-06-18 DIAGNOSIS — K449 Diaphragmatic hernia without obstruction or gangrene: Secondary | ICD-10-CM

## 2021-07-10 NOTE — Progress Notes (Signed)
Sent message, via epic in basket, requesting orders in epic from surgeon.  

## 2021-07-12 ENCOUNTER — Ambulatory Visit: Payer: Self-pay | Admitting: Surgery

## 2021-07-12 NOTE — Progress Notes (Addendum)
COVID swab appointment: n/a  COVID Vaccine Completed: n/a Date COVID Vaccine completed: Has received booster: COVID vaccine manufacturer: Pfizer    [facility] [employer]   Date of COVID positive in last 90 days: no  PCP - Kirstie Peri, MD Cardiologist - n/a  Chest x-ray - 06/01/21 Epic EKG - 05/21/21 req Stress Test - n/a ECHO - n/a Cardiac Cath - n/a Pacemaker/ICD device last checked: n/a Spinal Cord Stimulator: n/a  Sleep Study - n/a CPAP -   Fasting Blood Sugar - n/a Checks Blood Sugar _____ times a day  Blood Thinner Instructions: n/a Aspirin Instructions: Last Dose:  Activity level: Can go up a flight of stairs and perform activities of daily living without stopping and without symptoms of chest pain or shortness of breath.     Anesthesia review:   Patient denies shortness of breath, fever, cough and chest pain at PAT appointment   Patient verbalized understanding of instructions that were given to them at the PAT appointment. Patient was also instructed that they will need to review over the PAT instructions again at home before surgery.

## 2021-07-13 NOTE — Patient Instructions (Addendum)
DUE TO COVID-19 ONLY ONE VISITOR IS ALLOWED TO COME WITH YOU AND STAY IN THE WAITING ROOM ONLY DURING PRE OP AND PROCEDURE.   **NO VISITORS ARE ALLOWED IN THE SHORT STAY AREA OR RECOVERY ROOM!!**       Your procedure is scheduled on: 07/24/21   Report to [facility] Main Entrance    Report to admitting at 10:15 AM   Call this number if you have problems the morning of surgery [phone]   Do not eat food :After Midnight.   May have liquids until 9:30 AM day of surgery  CLEAR LIQUID DIET  Foods Allowed                                                                     Foods Excluded  Water, Black Coffee and tea (no milk or creamer)           liquids that you cannot  Plain Jell-O in any flavor  (No red)                                    see through such as: Fruit ices (not with fruit pulp)                                            milk, soups, orange juice              Iced Popsicles (No red)                                               All solid food                                   Apple juices Sports drinks like Gatorade (No red) Lightly seasoned clear broth or consume(fat free) Sugar      The day of surgery:  Drink ONE (1) Pre-Surgery Clear Ensure by 9:30 am the morning of surgery. Drink in one sitting. Do not sip.  This drink was given to you during your hospital  pre-op appointment visit. Nothing else to drink after completing the  Pre-Surgery Clear Ensure.          If you have questions, please contact your surgeons office.     Oral Hygiene is also important to reduce your risk of infection.                                    Remember - BRUSH YOUR TEETH THE MORNING OF SURGERY WITH YOUR REGULAR TOOTHPASTE   Take these medicines the morning of surgery with A SIP OF WATER: Inhalers, Amlodipine  You may not have any metal on your body including jewelry, and body piercing             Do not wear lotions, powders, cologne, or  deodorant              Men may shave face and neck.   Do not bring valuables to the hospital. [facility] IS NOT             RESPONSIBLE   FOR VALUABLES.   Contacts, dentures or bridgework may not be worn into surgery.   Patients discharged on the day of surgery will not be allowed to drive home.              Please read over the following fact sheets you were given: IF YOU HAVE QUESTIONS ABOUT YOUR PRE-OP INSTRUCTIONS PLEASE CALL [phone]- [facility] - Preparing for Surgery Before surgery, you can play an important role.  Because skin is not sterile, your skin needs to be as free of germs as possible.  You can reduce the number of germs on your skin by washing with CHG (chlorahexidine gluconate) soap before surgery.  CHG is an antiseptic cleaner which kills germs and bonds with the skin to continue killing germs even after washing. Please DO NOT use if you have an allergy to CHG or antibacterial soaps.  If your skin becomes reddened/irritated stop using the CHG and inform your nurse when you arrive at Short Stay. Do not shave (including legs and underarms) for at least 48 hours prior to the first CHG shower.  You may shave your face/neck.  Please follow these instructions carefully:  1.  Shower with CHG Soap the night before surgery and the  morning of surgery.  2.  If you choose to wash your hair, wash your hair first as usual with your normal  shampoo.  3.  After you shampoo, rinse your hair and body thoroughly to remove the shampoo.                             4.  Use CHG as you would any other liquid soap.  You can apply chg directly to the skin and wash.  Gently with a scrungie or clean washcloth.  5.  Apply the CHG Soap to your body ONLY FROM THE NECK DOWN.   Do   not use on face/ open                           Wound or open sores. Avoid contact with eyes, ears mouth and   genitals (private parts).                       Wash face,  Genitals (private parts) with your  normal soap.             6.  Wash thoroughly, paying special attention to the area where your    surgery  will be performed.  7.  Thoroughly rinse your body with warm water from the neck down.  8.  DO NOT shower/wash with your normal soap after using and rinsing off the CHG Soap.                9.  Pat yourself dry with a clean towel.            10.  Wear clean  pajamas.            11.  Place clean sheets on your bed the night of your first shower and do not  sleep with pets. Day of Surgery : Do not apply any lotions/deodorants the morning of surgery.  Please wear clean clothes to the hospital/surgery center.  FAILURE TO FOLLOW THESE INSTRUCTIONS MAY RESULT IN THE CANCELLATION OF YOUR SURGERY  PATIENT SIGNATURE_________________________________  NURSE SIGNATURE__________________________________  ________________________________________________________________________

## 2021-07-16 ENCOUNTER — Encounter (HOSPITAL_COMMUNITY)
Admission: RE | Admit: 2021-07-16 | Discharge: 2021-07-16 | Disposition: A | Discharge status: Home / Self Care | Payer: BC Managed Care – PPO | Source: Ambulatory Visit | Attending: Surgery | Admitting: Surgery

## 2021-07-16 ENCOUNTER — Other Ambulatory Visit: Payer: Self-pay

## 2021-07-16 ENCOUNTER — Encounter (HOSPITAL_COMMUNITY): Payer: Self-pay

## 2021-07-16 VITALS — HR 71 | Temp 97.9°F | Ht 64.0 in | Wt 140.4 lb

## 2021-07-16 DIAGNOSIS — I10 Essential (primary) hypertension: Secondary | ICD-10-CM | POA: Insufficient documentation

## 2021-07-16 DIAGNOSIS — Z01812 Encounter for preprocedural laboratory examination: Secondary | ICD-10-CM | POA: Insufficient documentation

## 2021-07-16 HISTORY — DX: Personal history of urinary calculi: Z87.442

## 2021-07-16 LAB — BASIC METABOLIC PANEL
Anion gap: 6 (ref 5–15)
BUN: 19 mg/dL (ref 6–20)
CO2: 26 mmol/L (ref 22–32)
Calcium: 9 mg/dL (ref 8.9–10.3)
Chloride: 106 mmol/L (ref 98–111)
Creatinine, Ser: 1.15 mg/dL (ref 0.61–1.24)
GFR, Estimated: >60 mL/min (ref >60)
Glucose, Bld: 96 mg/dL (ref 70–99)
Potassium: 3.8 mmol/L (ref 3.5–5.1)
Sodium: 138 mmol/L (ref 135–145)

## 2021-07-16 LAB — CBC
HCT: 44.1 % (ref 39.0–52.0)
Hemoglobin: 14.3 g/dL (ref 13.0–17.0)
MCH: 26.9 pg (ref 26.0–34.0)
MCHC: 32.4 g/dL (ref 30.0–36.0)
MCV: 82.9 fL (ref 80.0–100.0)
Platelets: 137 10*3/uL — ABNORMAL LOW (ref 150–400)
RBC: 5.32 MIL/uL (ref 4.22–5.81)
RDW: 15.2 % (ref 11.5–15.5)
WBC: 6.3 10*3/uL (ref 4.0–10.5)
nRBC: 0 % (ref 0.0–0.2)

## 2021-07-23 NOTE — H&P (Signed)
Chief Complaint:  recurrent hiatal hernia  History of Present Illness:  Raymond Watson is an 50 y.o. male who had a laparoscopic repair of a huge hiatal hernia back in May.  He has had a recurrence noted by CT.    He came to the office and I brought him and his wife into my office and showed them his upper GI series which showed in fact a near complete incarceration of his stomach into his chest. When I did his original surgery it was very impressive how completely his sac stripped out and we had a easy completely reduced stomach. The closure was done posteriorly with the Endo Stitch as I did this when laparoscopically and then he had a Nissen fundoplication. I was very surprised that this recurred and was seen on a CT in June. Whether he went back to work too early and strained and there was a tear in his hiatal closure allowing all of his stomach to be sucked back up into his chest would appear to be what may have happened. This is not going to go away but I think puts him at some risk for ischemia of this fundus so I recommended going back. Also given the option of having another opinion elsewhere. He would like to go and get this done as soon as possible. I think and as I described to him this would likely need to be done with a G-tube placement.  He and his wife seemed understand so I went ahead and applied in OR posting sheet to try to get them done before the end of the year..  Review of Systems: See HPI as well for other ROS.  ROS   Medical History: Past Medical History:  Diagnosis Date   GERD (gastroesophageal reflux disease)   Hyperlipidemia   Patient Active Problem List  Diagnosis   Acute respiratory failure with hypoxia (CMS-HCC)   AKI (acute kidney injury) (CMS-HCC)   Bronchiectasis without complication (CMS-HCC)   Chronic respiratory failure with hypoxia (CMS-HCC)   Cough   Diaphragmatic hernia without obstruction or gangrene   Diarrhea   DOE (dyspnea on exertion)   History  of repair of hiatal hernia   Hyperlipemia   Hypoxia   Pneumonia due to COVID-19 virus   White coat syndrome with hypertension   Past Surgical History:  Procedure Laterality Date   DIAPHRAGMATIC HERNIA REPAIR N/A 11/2020  all of stomach in chest    No Known Allergies  Current Outpatient Medications on File Prior to Visit  Medication Sig Dispense Refill   albuterol 90 mcg/actuation inhaler Inhale into the lungs   budesonide-formoteroL (SYMBICORT) 160-4.5 mcg/actuation inhaler Inhale into the lungs   dexlansoprazole (DEXILANT) 60 mg DR capsule Take by mouth   rosuvastatin (CRESTOR) 5 MG tablet Take by mouth   No current facility-administered medications on file prior to visit.   Family History  Problem Relation Age of Onset   Coronary Artery Disease (Blocked arteries around heart) Mother    Social History   Tobacco Use  Smoking Status Never  Smokeless Tobacco Never    Social History   Socioeconomic History   Marital status: Married  Tobacco Use   Smoking status: Never   Smokeless tobacco: Never  Vaping Use   Vaping Use: Never used  Substance and Sexual Activity   Alcohol use: Never   Drug use: Never   Objective:   There were no vitals filed for this visit.  There is no height or weight on file to  calculate BMI.  Physical Exam General: Thin male no acute distress he states he is passing a lot of flatus but not burping a lot  HEENT unremarkable Neck supple Chest clear Heart SR Abdomen healed trocar sites Ext FROM Neuro alert and oriented x 3 with normal motor and sensory function\ His physical exam did not appear to have changed much since our earlier encounters and surgery except for this recurrent hiatal hernia Labs, Imaging and Diagnostic Testing:  Upper GI series was reviewed with them. I previously looked at his CT scan as well.  Assessment and Plan:   Large recurrent hiatal hernia  I think we should go ahead and get him posted for robotic XI  repair of a recurrent large hiatal hernia. They were made aware of the risk and there is always a risk that if we can get this done or that there is cannot be failure yet again. I do not have a good explanation for why this failed so early in this case.    [name], MD     Past Medical History:  Diagnosis Date   GERD (gastroesophageal reflux disease)    Headache    History of kidney stones    Hyperlipidemia    Hypertension    no meds   Pneumonia 04/2020   due to Covid    Past Surgical History:  Procedure Laterality Date   HERNIA REPAIR     age [age]   XI ROBOTIC ASSISTED HIATAL HERNIA REPAIR N/A 12/04/2020   Procedure: LAPAROSCOPIC  HIATAL HERNIA REPAIR WITH FUNDOPLICATION;  Surgeon: Luretha Murphy, MD;  Location: [facility];  Service: General;  Laterality: N/A;    No current facility-administered medications for this encounter.   Current Outpatient Medications  Medication Sig Dispense Refill   albuterol (VENTOLIN HFA) 108 (90 Base) MCG/ACT inhaler Inhale 2 puffs into the lungs every 6 (six) hours as needed for wheezing or shortness of breath.     amLODipine (NORVASC) 2.5 MG tablet Take 2.5 mg by mouth daily.     budesonide-formoterol (SYMBICORT) 80-4.5 MCG/ACT inhaler Take 2 puffs first thing in am and then another 2 puffs about 12 hours later. 1 each 12   cholecalciferol (VITAMIN D) 25 MCG (1000 UNIT) tablet Take 1,000 Units by mouth in the morning.     Multiple Vitamin (MULTIVITAMIN WITH MINERALS) TABS tablet Take 1 tablet by mouth in the morning. One-A-Day for Men 50+     rosuvastatin (CRESTOR) 5 MG tablet Take 5 mg by mouth at bedtime.     Zinc 50 MG TABS Take 50 mg by mouth in the morning.     Patient has no known allergies. No family history on file. Social History:   reports that he has never smoked. He has never used smokeless tobacco. He reports that he does not currently use drugs. He reports that he does not drink alcohol.   REVIEW OF SYSTEMS  : Negative except for see problem list  Physical Exam:   There were no vitals taken for this visit. There is no height or weight on file to calculate BMI.  Gen:  WDWN male NAD  Neurological: Alert and oriented to person, place, and time. Motor and sensory function is grossly intact  Head: Normocephalic and atraumatic.  Eyes: Conjunctivae are normal. Pupils are equal, round, and reactive to light. No scleral icterus.  Neck: Normal range of motion. Neck supple. No tracheal deviation or thyromegaly present.  Cardiovascular:  SR without murmurs or gallops.  No  carotid bruits Breast: Not examined Respiratory: Effort normal.  No respiratory distress. No chest wall tenderness. Breath sounds normal.  No wheezes, rales or rhonchi.  Abdomen: Nontender with healed trocar sites GU: Not examined Musculoskeletal: Normal range of motion. Extremities are nontender. No cyanosis, edema or clubbing noted Lymphadenopathy: No cervical, preauricular, postauricular or axillary adenopathy is present Skin: Skin is warm and dry. No rash noted. No diaphoresis. No erythema. No pallor. Pscyh: Normal mood and affect. Behavior is normal. Judgment and thought content normal.   LABORATORY RESULTS: No results found for this or any previous visit (from the past 48 hour(s)).   RADIOLOGY RESULTS: No results found.  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  I have discussed this with him and his wife and will proceed with redo hiatal hernia with probable pexy.      Matt B. [name], MD, [facility] [phone]  beeper [phone]  07/23/2021 6:58 PM

## 2021-07-24 ENCOUNTER — Encounter (HOSPITAL_COMMUNITY)
Admission: AD | Disposition: A | Discharge status: Home / Self Care | Payer: Self-pay | Source: Home / Self Care | Attending: Surgery

## 2021-07-24 ENCOUNTER — Other Ambulatory Visit: Payer: Self-pay

## 2021-07-24 ENCOUNTER — Ambulatory Visit (HOSPITAL_COMMUNITY): Payer: BC Managed Care – PPO | Admitting: Certified Registered Nurse Anesthetist

## 2021-07-24 ENCOUNTER — Encounter (HOSPITAL_COMMUNITY): Payer: Self-pay | Admitting: Surgery

## 2021-07-24 ENCOUNTER — Inpatient Hospital Stay (HOSPITAL_COMMUNITY)
Admission: AD | Admit: 2021-07-24 | Discharge: 2021-07-28 | DRG: 326 | Disposition: A | Discharge status: Home / Self Care | Payer: BC Managed Care – PPO | Attending: Surgery | Admitting: Surgery

## 2021-07-24 DIAGNOSIS — K449 Diaphragmatic hernia without obstruction or gangrene: Secondary | ICD-10-CM | POA: Diagnosis present

## 2021-07-24 DIAGNOSIS — Z8616 Personal history of COVID-19: Secondary | ICD-10-CM | POA: Diagnosis not present

## 2021-07-24 DIAGNOSIS — Z79899 Other long term (current) drug therapy: Secondary | ICD-10-CM | POA: Diagnosis not present

## 2021-07-24 DIAGNOSIS — R933 Abnormal findings on diagnostic imaging of other parts of digestive tract: Secondary | ICD-10-CM | POA: Diagnosis not present

## 2021-07-24 DIAGNOSIS — Z8249 Family history of ischemic heart disease and other diseases of the circulatory system: Secondary | ICD-10-CM

## 2021-07-24 DIAGNOSIS — E440 Moderate protein-calorie malnutrition: Secondary | ICD-10-CM | POA: Diagnosis present

## 2021-07-24 DIAGNOSIS — K3189 Other diseases of stomach and duodenum: Secondary | ICD-10-CM | POA: Diagnosis present

## 2021-07-24 DIAGNOSIS — I10 Essential (primary) hypertension: Secondary | ICD-10-CM | POA: Diagnosis present

## 2021-07-24 DIAGNOSIS — Z7951 Long term (current) use of inhaled steroids: Secondary | ICD-10-CM | POA: Diagnosis not present

## 2021-07-24 DIAGNOSIS — Z825 Family history of asthma and other chronic lower respiratory diseases: Secondary | ICD-10-CM | POA: Diagnosis not present

## 2021-07-24 DIAGNOSIS — Z01818 Encounter for other preprocedural examination: Secondary | ICD-10-CM

## 2021-07-24 DIAGNOSIS — J9621 Acute and chronic respiratory failure with hypoxia: Secondary | ICD-10-CM | POA: Diagnosis present

## 2021-07-24 DIAGNOSIS — E785 Hyperlipidemia, unspecified: Secondary | ICD-10-CM | POA: Diagnosis present

## 2021-07-24 DIAGNOSIS — Z8719 Personal history of other diseases of the digestive system: Secondary | ICD-10-CM

## 2021-07-24 DIAGNOSIS — Z6824 Body mass index (BMI) 24.0-24.9, adult: Secondary | ICD-10-CM | POA: Diagnosis not present

## 2021-07-24 DIAGNOSIS — K219 Gastro-esophageal reflux disease without esophagitis: Secondary | ICD-10-CM | POA: Diagnosis present

## 2021-07-24 DIAGNOSIS — R0789 Other chest pain: Secondary | ICD-10-CM | POA: Diagnosis not present

## 2021-07-24 DIAGNOSIS — Z87442 Personal history of urinary calculi: Secondary | ICD-10-CM

## 2021-07-24 DIAGNOSIS — R112 Nausea with vomiting, unspecified: Secondary | ICD-10-CM | POA: Diagnosis not present

## 2021-07-24 HISTORY — PX: XI ROBOTIC ASSISTED HIATAL HERNIA REPAIR: SHX6889

## 2021-07-24 HISTORY — PX: UPPER GI ENDOSCOPY: SHX6162

## 2021-07-24 LAB — CBC
HCT: 37.6 % — ABNORMAL LOW (ref 39.0–52.0)
Hemoglobin: 11.8 g/dL — ABNORMAL LOW (ref 13.0–17.0)
MCH: 26.3 pg (ref 26.0–34.0)
MCHC: 31.4 g/dL (ref 30.0–36.0)
MCV: 83.7 fL (ref 80.0–100.0)
Platelets: 123 10*3/uL — ABNORMAL LOW (ref 150–400)
RBC: 4.49 MIL/uL (ref 4.22–5.81)
RDW: 15.4 % (ref 11.5–15.5)
WBC: 10.6 10*3/uL — ABNORMAL HIGH (ref 4.0–10.5)
nRBC: 0 % (ref 0.0–0.2)

## 2021-07-24 LAB — CREATININE, SERUM
Creatinine, Ser: 1.41 mg/dL — ABNORMAL HIGH (ref 0.61–1.24)
GFR, Estimated: 60 mL/min — ABNORMAL LOW (ref >60)

## 2021-07-24 LAB — SARS CORONAVIRUS 2 BY RT PCR (HOSPITAL ORDER, PERFORMED IN ~~LOC~~ HOSPITAL LAB): SARS Coronavirus 2: NEGATIVE

## 2021-07-24 SURGERY — REPAIR, HERNIA, HIATAL, ROBOT-ASSISTED
Anesthesia: General | Site: Esophagus

## 2021-07-24 MED ORDER — SODIUM CHLORIDE 0.9 % IV SOLN
2.0000 g | Freq: Two times a day (BID) | INTRAVENOUS | Status: AC
  Administered 2021-07-24: 22:00:00 2 g via INTRAVENOUS
  Filled 2021-07-24: qty 2

## 2021-07-24 MED ORDER — ROCURONIUM BROMIDE 10 MG/ML (PF) SYRINGE
PREFILLED_SYRINGE | INTRAVENOUS | Status: DC | PRN
Start: 2021-07-24 — End: 2021-07-24
  Administered 2021-07-24 (×2): 10 mg via INTRAVENOUS
  Administered 2021-07-24: 50 mg via INTRAVENOUS
  Administered 2021-07-24: 10 mg via INTRAVENOUS
  Administered 2021-07-24: 20 mg via INTRAVENOUS
  Administered 2021-07-24: 10 mg via INTRAVENOUS
  Administered 2021-07-24: 30 mg via INTRAVENOUS

## 2021-07-24 MED ORDER — SODIUM CHLORIDE (PF) 0.9 % IJ SOLN
INTRAMUSCULAR | Status: AC
  Filled 2021-07-24: qty 10

## 2021-07-24 MED ORDER — FENTANYL CITRATE (PF) 100 MCG/2ML IJ SOLN
INTRAMUSCULAR | Status: AC
  Filled 2021-07-24: qty 2

## 2021-07-24 MED ORDER — ONDANSETRON 4 MG PO TBDP: 4.0000 mg | ORAL_TABLET | Freq: Four times a day (QID) | ORAL | Status: DC | PRN

## 2021-07-24 MED ORDER — ROCURONIUM BROMIDE 10 MG/ML (PF) SYRINGE
PREFILLED_SYRINGE | INTRAVENOUS | Status: AC
  Filled 2021-07-24: qty 10

## 2021-07-24 MED ORDER — ONDANSETRON HCL 4 MG/2ML IJ SOLN
INTRAMUSCULAR | Status: DC | PRN
  Administered 2021-07-24: 4 mg via INTRAVENOUS

## 2021-07-24 MED ORDER — OXYCODONE HCL 5 MG PO TABS: 5.0000 mg | ORAL_TABLET | Freq: Once | ORAL | Status: DC | PRN

## 2021-07-24 MED ORDER — ONDANSETRON HCL 4 MG/2ML IJ SOLN
INTRAMUSCULAR | Status: AC
  Filled 2021-07-24: qty 2

## 2021-07-24 MED ORDER — PHENYLEPHRINE HCL-NACL 20-0.9 MG/250ML-% IV SOLN
INTRAVENOUS | Status: DC | PRN
  Administered 2021-07-24: 30 ug/min via INTRAVENOUS

## 2021-07-24 MED ORDER — PHENYLEPHRINE HCL (PRESSORS) 10 MG/ML IV SOLN
INTRAVENOUS | Status: AC
  Filled 2021-07-24: qty 1

## 2021-07-24 MED ORDER — ALBUMIN HUMAN 5 % IV SOLN
INTRAVENOUS | Status: AC
  Filled 2021-07-24: qty 500

## 2021-07-24 MED ORDER — ACETAMINOPHEN 500 MG PO TABS
1000.0000 mg | ORAL_TABLET | ORAL | Status: AC
  Administered 2021-07-24: 10:00:00 1000 mg via ORAL
  Filled 2021-07-24: qty 2

## 2021-07-24 MED ORDER — LABETALOL HCL 5 MG/ML IV SOLN
INTRAVENOUS | Status: AC
  Filled 2021-07-24: qty 4

## 2021-07-24 MED ORDER — HEPARIN SODIUM (PORCINE) 5000 UNIT/ML IJ SOLN
5000.0000 [IU] | Freq: Three times a day (TID) | INTRAMUSCULAR | Status: DC
  Administered 2021-07-25 – 2021-07-28 (×10): 5000 [IU] via SUBCUTANEOUS
  Filled 2021-07-24 (×10): qty 1

## 2021-07-24 MED ORDER — AMLODIPINE BESYLATE 5 MG PO TABS
2.5000 mg | ORAL_TABLET | Freq: Every day | ORAL | Status: DC
  Administered 2021-07-26 – 2021-07-28 (×3): 2.5 mg via ORAL
  Filled 2021-07-24 (×4): qty 1

## 2021-07-24 MED ORDER — ONDANSETRON HCL 4 MG/2ML IJ SOLN: 4.0000 mg | Freq: Four times a day (QID) | INTRAMUSCULAR | Status: DC | PRN

## 2021-07-24 MED ORDER — LIDOCAINE 2% (20 MG/ML) 5 ML SYRINGE
INTRAMUSCULAR | Status: DC | PRN
  Administered 2021-07-24: 60 mg via INTRAVENOUS

## 2021-07-24 MED ORDER — BUPIVACAINE LIPOSOME 1.3 % IJ SUSP
INTRAMUSCULAR | Status: DC | PRN
  Administered 2021-07-24: 20 mL

## 2021-07-24 MED ORDER — PROPOFOL 500 MG/50ML IV EMUL
INTRAVENOUS | Status: DC | PRN
  Administered 2021-07-24: 25 ug/kg/min via INTRAVENOUS

## 2021-07-24 MED ORDER — HYDROMORPHONE HCL 2 MG/ML IJ SOLN
INTRAMUSCULAR | Status: AC
  Filled 2021-07-24: qty 1

## 2021-07-24 MED ORDER — FENTANYL CITRATE PF 50 MCG/ML IJ SOSY: 25.0000 ug | PREFILLED_SYRINGE | INTRAMUSCULAR | Status: DC | PRN

## 2021-07-24 MED ORDER — SCOPOLAMINE 1 MG/3DAYS TD PT72
1.0000 | MEDICATED_PATCH | TRANSDERMAL | Status: DC
  Administered 2021-07-24: 10:00:00 1.5 mg via TRANSDERMAL
  Filled 2021-07-24: qty 1

## 2021-07-24 MED ORDER — FENTANYL CITRATE (PF) 100 MCG/2ML IJ SOLN
INTRAMUSCULAR | Status: DC | PRN
  Administered 2021-07-24 (×3): 50 ug via INTRAVENOUS
  Administered 2021-07-24: 100 ug via INTRAVENOUS
  Administered 2021-07-24 (×3): 50 ug via INTRAVENOUS

## 2021-07-24 MED ORDER — PROCHLORPERAZINE MALEATE 10 MG PO TABS
10.0000 mg | ORAL_TABLET | Freq: Four times a day (QID) | ORAL | Status: DC | PRN
  Filled 2021-07-24: qty 1

## 2021-07-24 MED ORDER — KETAMINE HCL 50 MG/5ML IJ SOSY
PREFILLED_SYRINGE | INTRAMUSCULAR | Status: AC
  Filled 2021-07-24: qty 5

## 2021-07-24 MED ORDER — METOPROLOL TARTRATE 5 MG/5ML IV SOLN: 5.0000 mg | Freq: Four times a day (QID) | INTRAVENOUS | Status: DC | PRN

## 2021-07-24 MED ORDER — PROMETHAZINE HCL 25 MG/ML IJ SOLN: 6.2500 mg | INTRAMUSCULAR | Status: DC | PRN

## 2021-07-24 MED ORDER — DEXAMETHASONE SODIUM PHOSPHATE 10 MG/ML IJ SOLN
INTRAMUSCULAR | Status: AC
  Filled 2021-07-24: qty 1

## 2021-07-24 MED ORDER — 0.9 % SODIUM CHLORIDE (POUR BTL) OPTIME
TOPICAL | Status: DC | PRN
  Administered 2021-07-24: 17:00:00 1000 mL

## 2021-07-24 MED ORDER — BUPIVACAINE LIPOSOME 1.3 % IJ SUSP
INTRAMUSCULAR | Status: AC
  Filled 2021-07-24: qty 20

## 2021-07-24 MED ORDER — HYDROMORPHONE HCL 1 MG/ML IJ SOLN
INTRAMUSCULAR | Status: DC | PRN
  Administered 2021-07-24: 1 mg via INTRAVENOUS
  Administered 2021-07-24 (×2): .5 mg via INTRAVENOUS

## 2021-07-24 MED ORDER — ALBUMIN HUMAN 5 % IV SOLN: INTRAVENOUS | Status: DC | PRN

## 2021-07-24 MED ORDER — LIDOCAINE HCL (PF) 2 % IJ SOLN
INTRAMUSCULAR | Status: AC
  Filled 2021-07-24: qty 5

## 2021-07-24 MED ORDER — KETAMINE HCL 10 MG/ML IJ SOLN
INTRAMUSCULAR | Status: DC | PRN
  Administered 2021-07-24 (×2): 25 mg via INTRAVENOUS

## 2021-07-24 MED ORDER — PHENYLEPHRINE 40 MCG/ML (10ML) SYRINGE FOR IV PUSH (FOR BLOOD PRESSURE SUPPORT)
PREFILLED_SYRINGE | INTRAVENOUS | Status: AC
  Filled 2021-07-24: qty 10

## 2021-07-24 MED ORDER — LACTATED RINGERS IV SOLN: INTRAVENOUS | Status: DC

## 2021-07-24 MED ORDER — SODIUM CHLORIDE (PF) 0.9 % IJ SOLN
INTRAMUSCULAR | Status: DC | PRN
  Administered 2021-07-24: 10 mL

## 2021-07-24 MED ORDER — MIDAZOLAM HCL 5 MG/5ML IJ SOLN
INTRAMUSCULAR | Status: DC | PRN
  Administered 2021-07-24: 2 mg via INTRAVENOUS

## 2021-07-24 MED ORDER — ORAL CARE MOUTH RINSE: 15.0000 mL | Freq: Once | OROMUCOSAL | Status: AC

## 2021-07-24 MED ORDER — PROCHLORPERAZINE EDISYLATE 10 MG/2ML IJ SOLN: 5.0000 mg | Freq: Four times a day (QID) | INTRAMUSCULAR | Status: DC | PRN

## 2021-07-24 MED ORDER — LABETALOL HCL 5 MG/ML IV SOLN
10.0000 mg | Freq: Once | INTRAVENOUS | Status: AC
  Administered 2021-07-24: 19:00:00 10 mg via INTRAVENOUS

## 2021-07-24 MED ORDER — PANTOPRAZOLE SODIUM 40 MG IV SOLR
40.0000 mg | Freq: Every day | INTRAVENOUS | Status: DC
  Administered 2021-07-24 – 2021-07-27 (×4): 40 mg via INTRAVENOUS
  Filled 2021-07-24 (×4): qty 40

## 2021-07-24 MED ORDER — CHLORHEXIDINE GLUCONATE 0.12 % MT SOLN
15.0000 mL | Freq: Once | OROMUCOSAL | Status: AC
  Administered 2021-07-24: 10:00:00 15 mL via OROMUCOSAL

## 2021-07-24 MED ORDER — LACTATED RINGERS IR SOLN
Status: DC | PRN
  Administered 2021-07-24: 1000 mL

## 2021-07-24 MED ORDER — KCL IN DEXTROSE-NACL 20-5-0.45 MEQ/L-%-% IV SOLN
INTRAVENOUS | Status: DC
  Filled 2021-07-24 (×6): qty 1000

## 2021-07-24 MED ORDER — PROPOFOL 500 MG/50ML IV EMUL
INTRAVENOUS | Status: AC
  Filled 2021-07-24: qty 50

## 2021-07-24 MED ORDER — SODIUM CHLORIDE 0.9 % IV SOLN
2.0000 g | INTRAVENOUS | Status: AC
  Administered 2021-07-24: 13:00:00 2 g via INTRAVENOUS
  Filled 2021-07-24: qty 2

## 2021-07-24 MED ORDER — LABETALOL HCL 5 MG/ML IV SOLN
10.0000 mg | Freq: Once | INTRAVENOUS | Status: AC
  Administered 2021-07-24: 18:00:00 10 mg via INTRAVENOUS

## 2021-07-24 MED ORDER — PROPOFOL 10 MG/ML IV BOLUS
INTRAVENOUS | Status: AC
  Filled 2021-07-24: qty 20

## 2021-07-24 MED ORDER — FENTANYL CITRATE (PF) 250 MCG/5ML IJ SOLN
INTRAMUSCULAR | Status: AC
  Filled 2021-07-24: qty 5

## 2021-07-24 MED ORDER — CHLORHEXIDINE GLUCONATE CLOTH 2 % EX PADS: 6.0000 | MEDICATED_PAD | Freq: Once | CUTANEOUS | Status: DC

## 2021-07-24 MED ORDER — PHENYLEPHRINE 40 MCG/ML (10ML) SYRINGE FOR IV PUSH (FOR BLOOD PRESSURE SUPPORT)
PREFILLED_SYRINGE | INTRAVENOUS | Status: DC | PRN
  Administered 2021-07-24: 80 ug via INTRAVENOUS
  Administered 2021-07-24: 120 ug via INTRAVENOUS
  Administered 2021-07-24: 80 ug via INTRAVENOUS

## 2021-07-24 MED ORDER — DEXAMETHASONE SODIUM PHOSPHATE 4 MG/ML IJ SOLN
INTRAMUSCULAR | Status: DC | PRN
  Administered 2021-07-24: 10 mg via INTRAVENOUS

## 2021-07-24 MED ORDER — SUGAMMADEX SODIUM 200 MG/2ML IV SOLN
INTRAVENOUS | Status: DC | PRN
  Administered 2021-07-24: 200 mg via INTRAVENOUS

## 2021-07-24 MED ORDER — BUPIVACAINE LIPOSOME 1.3 % IJ SUSP: 20.0000 mL | Freq: Once | INTRAMUSCULAR | Status: DC

## 2021-07-24 MED ORDER — FENTANYL CITRATE PF 50 MCG/ML IJ SOSY
12.5000 ug | PREFILLED_SYRINGE | INTRAMUSCULAR | Status: DC | PRN
  Administered 2021-07-25 – 2021-07-26 (×16): 12.5 ug via INTRAVENOUS
  Filled 2021-07-24 (×16): qty 1

## 2021-07-24 MED ORDER — ALBUTEROL SULFATE (2.5 MG/3ML) 0.083% IN NEBU: 3.0000 mL | INHALATION_SOLUTION | Freq: Four times a day (QID) | RESPIRATORY_TRACT | Status: DC | PRN

## 2021-07-24 MED ORDER — OXYCODONE HCL 5 MG/5ML PO SOLN: 5.0000 mg | Freq: Once | ORAL | Status: DC | PRN

## 2021-07-24 MED ORDER — PROPOFOL 10 MG/ML IV BOLUS
INTRAVENOUS | Status: DC | PRN
  Administered 2021-07-24: 200 mg via INTRAVENOUS

## 2021-07-24 MED ORDER — MIDAZOLAM HCL 2 MG/2ML IJ SOLN
INTRAMUSCULAR | Status: AC
  Filled 2021-07-24: qty 2

## 2021-07-24 SURGICAL SUPPLY — 67 items
ADH SKN CLS APL DERMABOND .7 (GAUZE/BANDAGES/DRESSINGS) ×2
APPLIER CLIP 5 13 M/L LIGAMAX5 (MISCELLANEOUS)
APPLIER CLIP ROT 13.4 12 LRG (CLIP)
APR CLP LRG 13.4X12 ROT 20 MLT (CLIP)
APR CLP MED LRG 5 ANG JAW (MISCELLANEOUS)
BLADE SURG 15 STRL LF DISP TIS (BLADE) ×2 IMPLANT
BLADE SURG 15 STRL SS (BLADE) ×3
CLIP APPLIE 5 13 M/L LIGAMAX5 (MISCELLANEOUS) IMPLANT
CLIP APPLIE ROT 13.4 12 LRG (CLIP) IMPLANT
CLIP LIGATING HEM O LOK PURPLE (MISCELLANEOUS) IMPLANT
COVER SURGICAL LIGHT HANDLE (MISCELLANEOUS) ×3 IMPLANT
COVER TIP SHEARS 8 DVNC (MISCELLANEOUS) ×2 IMPLANT
COVER TIP SHEARS 8MM DA VINCI (MISCELLANEOUS) ×1
DECANTER SPIKE VIAL GLASS SM (MISCELLANEOUS) ×3 IMPLANT
DERMABOND ADVANCED (GAUZE/BANDAGES/DRESSINGS) ×1
DERMABOND ADVANCED .7 DNX12 (GAUZE/BANDAGES/DRESSINGS) ×2 IMPLANT
DEVICE TROCAR PUNCTURE CLOSURE (ENDOMECHANICALS) ×1 IMPLANT
DRAIN CHANNEL 19F RND (DRAIN) ×1 IMPLANT
DRAIN PENROSE 0.5X18 (DRAIN) ×3 IMPLANT
DRAPE ARM DVNC X/XI (DISPOSABLE) ×8 IMPLANT
DRAPE COLUMN DVNC XI (DISPOSABLE) ×2 IMPLANT
DRAPE DA VINCI XI ARM (DISPOSABLE) ×4
DRAPE DA VINCI XI COLUMN (DISPOSABLE) ×1
ELECT REM PT RETURN 15FT ADLT (MISCELLANEOUS) ×3 IMPLANT
EVACUATOR SILICONE 100CC (DRAIN) ×1 IMPLANT
GAUZE 4X4 16PLY ~~LOC~~+RFID DBL (SPONGE) ×3 IMPLANT
GLOVE SURG ENC TEXT LTX SZ8 (GLOVE) ×6 IMPLANT
GOWN STRL REUS W/TWL XL LVL3 (GOWN DISPOSABLE) ×13 IMPLANT
GRASPER SUT TROCAR 14GX15 (MISCELLANEOUS) IMPLANT
IRRIG SUCT STRYKERFLOW 2 WTIP (MISCELLANEOUS) ×3
IRRIGATION SUCT STRKRFLW 2 WTP (MISCELLANEOUS) ×2 IMPLANT
KIT BASIN OR (CUSTOM PROCEDURE TRAY) ×3 IMPLANT
KIT TURNOVER KIT A (KITS) IMPLANT
MARKER SKIN DUAL TIP RULER LAB (MISCELLANEOUS) ×3 IMPLANT
NEEDLE HYPO 22GX1.5 SAFETY (NEEDLE) ×3 IMPLANT
OBTURATOR OPTICAL STANDARD 8MM (TROCAR) ×1
OBTURATOR OPTICAL STND 8 DVNC (TROCAR) ×2
OBTURATOR OPTICALSTD 8 DVNC (TROCAR) ×2 IMPLANT
PACK CARDIOVASCULAR III (CUSTOM PROCEDURE TRAY) ×3 IMPLANT
PAD POSITIONING PINK XL (MISCELLANEOUS) IMPLANT
SCISSORS LAP 5X45 EPIX DISP (ENDOMECHANICALS) IMPLANT
SEAL CANN UNIV 5-8 DVNC XI (MISCELLANEOUS) ×8 IMPLANT
SEAL XI 5MM-8MM UNIVERSAL (MISCELLANEOUS) ×4
SEALER VESSEL DA VINCI XI (MISCELLANEOUS) ×1
SEALER VESSEL EXT DVNC XI (MISCELLANEOUS) ×2 IMPLANT
SHEARS HARMONIC ACE PLUS 36CM (ENDOMECHANICALS) ×1 IMPLANT
SOL ANTI FOG 6CC (MISCELLANEOUS) ×2 IMPLANT
SOLUTION ANTI FOG 6CC (MISCELLANEOUS) ×1
SOLUTION ELECTROLUBE (MISCELLANEOUS) ×2 IMPLANT
SPONGE DRAIN TRACH 4X4 STRL 2S (GAUZE/BANDAGES/DRESSINGS) ×1 IMPLANT
SUT ETHIBOND 0 36 GRN (SUTURE) ×9 IMPLANT
SUT ETHILON 2 0 PS N (SUTURE) ×1 IMPLANT
SUT MNCRL AB 4-0 PS2 18 (SUTURE) ×6 IMPLANT
SUT VICRYL 0 UR6 27IN ABS (SUTURE) IMPLANT
SYR 10ML ECCENTRIC (SYRINGE) ×3 IMPLANT
SYR 20ML LL LF (SYRINGE) ×3 IMPLANT
TIP INNERVISION DETACH 40FR (MISCELLANEOUS) IMPLANT
TIP INNERVISION DETACH 50FR (MISCELLANEOUS) IMPLANT
TIP INNERVISION DETACH 56FR (MISCELLANEOUS) IMPLANT
TIPS INNERVISION DETACH 40FR (MISCELLANEOUS)
TOWEL OR 17X26 10 PK STRL BLUE (TOWEL DISPOSABLE) ×3 IMPLANT
TRAY FOLEY MTR SLVR 16FR STAT (SET/KITS/TRAYS/PACK) IMPLANT
TROCAR ADV FIXATION 12X100MM (TROCAR) ×4 IMPLANT
TROCAR ADV FIXATION 5X100MM (TROCAR) IMPLANT
TROCAR BLADELESS OPT 5 100 (ENDOMECHANICALS) ×3 IMPLANT
TUBING CONNECTING 10 (TUBING) IMPLANT
TUBING INSUFFLATION 10FT LAP (TUBING) ×3 IMPLANT

## 2021-07-24 NOTE — Anesthesia Postprocedure Evaluation (Signed)
Anesthesia Post Note  Patient: Raymond Watson  Procedure(s) Performed: XI ROBOTIC REDO ASSISTED TAKE DOWN INCARCERATED HERNIA WITH GASTROPEXY (Abdomen) UPPER GI ENDOSCOPY (Esophagus)     Patient location during evaluation: PACU Anesthesia Type: General Level of consciousness: awake and alert Pain management: pain level controlled Vital Signs Assessment: post-procedure vital signs reviewed and stable Respiratory status: spontaneous breathing, nonlabored ventilation and respiratory function stable Cardiovascular status: stable and blood pressure returned to baseline Anesthetic complications: no   No notable events documented.  Last Vitals:  Vitals:   07/24/21 1948 07/24/21 2044  BP: (!) 151/108 (!) 128/93  Pulse: 86 94  Resp: 16 15  Temp: 36.6 C 36.8 C  SpO2: 100% 99%    Last Pain:  Vitals:   07/24/21 2044  TempSrc: Oral  PainSc:                  Beryle Lathe

## 2021-07-24 NOTE — Anesthesia Preprocedure Evaluation (Addendum)
Anesthesia Evaluation  Patient identified by MRN, date of birth, ID band Patient awake    Reviewed: Allergy & Precautions, NPO status , Patient's Chart, lab work & pertinent test results  History of Anesthesia Complications Negative for: history of anesthetic complications  Airway Mallampati: I  TM Distance: >3 FB Neck ROM: Full    Dental  (+) Dental Advisory Given, Teeth Intact   Pulmonary pneumonia, resolved,    Pulmonary exam normal        Cardiovascular hypertension, Pt. on medications Normal cardiovascular exam     Neuro/Psych  Headaches, negative psych ROS   GI/Hepatic Neg liver ROS, hiatal hernia, GERD  Medicated and Controlled,  Endo/Other  negative endocrine ROS  Renal/GU negative Renal ROS     Musculoskeletal negative musculoskeletal ROS (+)   Abdominal   Peds  Hematology negative hematology ROS (+)  Plt 137k    Anesthesia Other Findings   Reproductive/Obstetrics                            Anesthesia Physical Anesthesia Plan  ASA: 2  Anesthesia Plan: General   Post-op Pain Management: Tylenol PO (pre-op)   Induction: Intravenous  PONV Risk Score and Plan: 3 and Treatment may vary due to age or medical condition, Ondansetron, Dexamethasone and Midazolam  Airway Management Planned: Oral ETT  Additional Equipment: None  Intra-op Plan:   Post-operative Plan: Extubation in OR  Informed Consent: I have reviewed the patients History and Physical, chart, labs and discussed the procedure including the risks, benefits and alternatives for the proposed anesthesia with the patient or authorized representative who has indicated his/her understanding and acceptance.     Dental advisory given  Plan Discussed with: CRNA and Anesthesiologist  Anesthesia Plan Comments:        Anesthesia Quick Evaluation

## 2021-07-24 NOTE — Anesthesia Procedure Notes (Signed)
Procedure Name: Intubation Date/Time: 07/24/2021 12:36 PM Performed by: Vanessa Hutchinson, CRNA Pre-anesthesia Checklist: Patient identified, Emergency Drugs available, Suction available and Patient being monitored Patient Re-evaluated:Patient Re-evaluated prior to induction Oxygen Delivery Method: Circle system utilized Preoxygenation: Pre-oxygenation with 100% oxygen Induction Type: IV induction Ventilation: Mask ventilation without difficulty Laryngoscope Size: 2 and Miller Grade View: Grade I Tube type: Oral Tube size: 8.0 mm Number of attempts: 1 Airway Equipment and Method: Stylet Placement Confirmation: ETT inserted through vocal cords under direct vision, positive ETCO2 and breath sounds checked- equal and bilateral Secured at: 21 cm Tube secured with: Tape Dental Injury: Teeth and Oropharynx as per pre-operative assessment

## 2021-07-24 NOTE — Transfer of Care (Signed)
Immediate Anesthesia Transfer of Care Note  Patient: Raymond Watson  Procedure(s) Performed: XI ROBOTIC REDO ASSISTED TAKE DOWN INCARCERATED HERNIA WITH GASTROPEXY (Abdomen) UPPER GI ENDOSCOPY (Esophagus)  Patient Location: PACU  Anesthesia Type:General  Level of Consciousness: drowsy and patient cooperative  Airway & Oxygen Therapy: Patient Spontanous Breathing and Patient connected to face mask oxygen  Post-op Assessment: Report given to RN and Post -op Vital signs reviewed and stable  Post vital signs: Reviewed and stable  Last Vitals:  Vitals Value Taken Time  BP 125/92 07/24/21 1723  Temp 36.6 C 07/24/21 1723  Pulse 93 07/24/21 1724  Resp 12 07/24/21 1724  SpO2 98 % 07/24/21 1724  Vitals shown include unvalidated device data.  Last Pain:  Vitals:   07/24/21 1008  TempSrc:   PainSc: 0-No pain         Complications: No notable events documented.

## 2021-07-24 NOTE — Op Note (Signed)
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  After anesthesia was administered and the patient was prepped  with ChloraPrep and a timeout was performed.  Access was achieved to the left upper quadrant and 4 (8 mm) ports were placed and one 11 placed below on the left and an oblique fashion.  The robot was then docked and the camera targeted successfully.  The liver was stuck up anteriorly and did not require Nathanson retractor.  A large recurrent diaphragmatic fascial defect was noted that was broad.  Stomach was in there in its entirety.  We began a rather lengthy dissection using the vessel sealer to go through the thin adhesions that we could take down and with retraction and with tedious dissection we began to try to get this mobilized.  The frustrating thing was that the greater curvature the stomach was stuck up anteriorly perpendicular to the spine and obstructing the GE junction of the wrap.  Eventually that was taken down and the wrap was located and it was divided and freed but it did not completely come unwrapped but it was up in the chest.  The esophagus and the esophagogastric junction was above the diaphragm.  Everything was very stuck and we did we attempted to strip out another sac but when this initial hiatal hernia was done we got a nice complete removal of the sac so concerns were that this could be stuck to the  pericardium and it appeared to be stuck to the aorta superiorly.  Everything was taken down as best we could and we used endoscopy to try to delineate the anatomy and make sure we did not see any evidence of perforation.  Were able to to get into the stomach and decompressed it.  I placed a figure-of-eight suture in the anterior diaphragm and brought it together but this was very stringy and the diaphragm is weak and I do think we will be able to hold any substantial sutures and I think that was one of the reasons that the initial repair failed because the right and left crura could not really hold the suture closure.  Previously we have placed 5 such sutures.  I elected to try to straighten the stomach as best I could and I pexed the greater curvature along to the left crus in 3 places and then farther down to a fold in the diaphragm.  It would there was really not a way to get distance to do a PEG or a gastrostomy tube.  I elected to decompress the stomach and perform a gastropexy place a 19 Blake drain.  The patient received a tap block with Exparel at the beginning of the case.  Patient tolerated the procedure well.  There was no evidence of leaks that we could see either with insufflation we are performing endoscopy or at the end of the case.    The patient tolerated the procedure well  and was taken to the PACU in stable condition.     Matt B. [name], MD, [facility], [place] [phone]

## 2021-07-25 ENCOUNTER — Encounter (HOSPITAL_COMMUNITY): Payer: Self-pay | Admitting: Surgery

## 2021-07-25 ENCOUNTER — Inpatient Hospital Stay (HOSPITAL_COMMUNITY): Payer: BC Managed Care – PPO

## 2021-07-25 LAB — CBC
HCT: 35.9 % — ABNORMAL LOW (ref 39.0–52.0)
Hemoglobin: 11.6 g/dL — ABNORMAL LOW (ref 13.0–17.0)
MCH: 26.6 pg (ref 26.0–34.0)
MCHC: 32.3 g/dL (ref 30.0–36.0)
MCV: 82.3 fL (ref 80.0–100.0)
Platelets: 130 10*3/uL — ABNORMAL LOW (ref 150–400)
RBC: 4.36 MIL/uL (ref 4.22–5.81)
RDW: 15.5 % (ref 11.5–15.5)
WBC: 9.8 10*3/uL (ref 4.0–10.5)
nRBC: 0 % (ref 0.0–0.2)

## 2021-07-25 LAB — BASIC METABOLIC PANEL
Anion gap: 4 — ABNORMAL LOW (ref 5–15)
BUN: 16 mg/dL (ref 6–20)
CO2: 25 mmol/L (ref 22–32)
Calcium: 8.2 mg/dL — ABNORMAL LOW (ref 8.9–10.3)
Chloride: 107 mmol/L (ref 98–111)
Creatinine, Ser: 1.2 mg/dL (ref 0.61–1.24)
GFR, Estimated: >60 mL/min (ref >60)
Glucose, Bld: 144 mg/dL — ABNORMAL HIGH (ref 70–99)
Potassium: 4.5 mmol/L (ref 3.5–5.1)
Sodium: 136 mmol/L (ref 135–145)

## 2021-07-25 MED ORDER — ACETAMINOPHEN 10 MG/ML IV SOLN
1000.0000 mg | Freq: Four times a day (QID) | INTRAVENOUS | Status: AC | PRN
  Administered 2021-07-25 – 2021-07-26 (×4): 1000 mg via INTRAVENOUS
  Filled 2021-07-25 (×4): qty 100

## 2021-07-25 MED ORDER — IOHEXOL 300 MG/ML  SOLN
50.0000 mL | Freq: Once | INTRAMUSCULAR | Status: AC | PRN
  Administered 2021-07-25: 10:00:00 50 mL via ORAL

## 2021-07-25 NOTE — Progress Notes (Signed)
°  Transition of Care [facility]) Screening Note   Patient Details  Name: Raymond Watson Date of Birth: November 13, 1970   Transition of Care [facility]) CM/SW Contact:    Lanier Clam, RN Phone Number: 07/25/2021, 10:27 AM    Transition of Care Department [facility]) has reviewed patient and no TOC needs have been identified at this time. We will continue to monitor patient advancement through interdisciplinary progression rounds. If new patient transition needs arise, please place a TOC consult.

## 2021-07-25 NOTE — Progress Notes (Signed)
Patient ID: Raymond Watson, male   DOB: 1970/12/19, 50 y.o.   MRN: [id number] [facility] Surgery Progress Note:   1 Day Post-Op  Subjective: Mental status is clear.  Complaints sore in incisions. Objective: Vital signs in last 24 hours: Temp:  [97.8 F (36.6 C)-99.2 F (37.3 C)] 99.2 F (37.3 C) (12/28 0526) Pulse Rate:  [62-95] 80 (12/28 0526) Resp:  [12-16] 14 (12/28 0526) BP: (122-151)/(82-111) 125/89 (12/28 0526) SpO2:  [95 %-100 %] 100 % (12/28 0526)  Intake/Output from previous day: 12/27 0701 - 12/28 0700 In: 4535.5 [I.V.:3839.8; IV Piggyback:695.7] Out: 2840 [Urine:2425; Drains:115; Blood:300] Intake/Output this shift: No intake/output data recorded.  Physical Exam: Work of breathing is not labored.  Incision OK.  JP serosanguinous  Lab Results:  Results for orders placed or performed during the hospital encounter of 07/24/21 (from the past 48 hour(s))  SARS Coronavirus 2 by RT PCR (hospital order, performed in [facility] hospital lab) Nasopharyngeal Nasopharyngeal Swab     Status: None   Collection Time: 07/24/21  9:55 AM   Specimen: Nasopharyngeal Swab  Result Value Ref Range   SARS Coronavirus 2 NEGATIVE NEGATIVE    Comment: (NOTE) SARS-CoV-2 target nucleic acids are NOT DETECTED.  The SARS-CoV-2 RNA is generally detectable in upper and lower respiratory specimens during the acute phase of infection. The lowest concentration of SARS-CoV-2 viral copies this assay can detect is 250 copies / mL. A negative result does not preclude SARS-CoV-2 infection and should not be used as the sole basis for treatment or other patient management decisions.  A negative result may occur with improper specimen collection / handling, submission of specimen other than nasopharyngeal swab, presence of viral mutation(s) within the areas targeted by this assay, and inadequate number of viral copies (<250 copies / mL). A negative result must be combined with clinical observations,  patient history, and epidemiological information.  Fact Sheet for Patients:   BoilerBrush.com.cy  Fact Sheet for Healthcare Providers: https://pope.com/  This test is not yet approved or  cleared by the [place] FDA and has been authorized for detection and/or diagnosis of SARS-CoV-2 by FDA under an Emergency Use Authorization (EUA).  This EUA will remain in effect (meaning this test can be used) for the duration of the COVID-19 declaration under Section 564(b)(1) of the Act, 21 U.S.C. section 360bbb-3(b)(1), unless the authorization is terminated or revoked sooner.  Performed at [facility], [address] [place], [place] [address]   CBC     Status: Abnormal   Collection Time: 07/24/21  8:38 PM  Result Value Ref Range   WBC 10.6 (H) 4.0 - 10.5 K/uL   RBC 4.49 4.22 - 5.81 MIL/uL   Hemoglobin 11.8 (L) 13.0 - 17.0 g/dL   HCT 75.6 (L) 43.3 - 29.5 %   MCV 83.7 80.0 - 100.0 fL   MCH 26.3 26.0 - 34.0 pg   MCHC 31.4 30.0 - 36.0 g/dL   RDW 18.8 41.6 - 60.6 %   Platelets 123 (L) 150 - 400 K/uL    Comment: Immature Platelet Fraction may be clinically indicated, consider ordering this additional test [id number]    nRBC 0.0 0.0 - 0.2 %    Comment: Performed at [facility], [address], [place], [place] [address]  Creatinine, serum     Status: Abnormal   Collection Time: 07/24/21  8:38 PM  Result Value Ref Range   Creatinine, Ser 1.41 (H) 0.61 - 1.24 mg/dL   GFR, Estimated  60 (L) >60 mL/min    Comment: (NOTE) Calculated using the CKD-EPI Creatinine Equation (2021) Performed at [facility], [address] [place], [place] [address]   Basic metabolic panel     Status: Abnormal   Collection Time: 07/25/21  4:32 AM  Result Value Ref Range   Sodium 136 135 - 145 mmol/L   Potassium 4.5 3.5 - 5.1 mmol/L   Chloride 107 98 - 111 mmol/L   CO2 25 22 - 32 mmol/L   Glucose,  Bld 144 (H) 70 - 99 mg/dL    Comment: Glucose reference range applies only to samples taken after fasting for at least 8 hours.   BUN 16 6 - 20 mg/dL   Creatinine, Ser 8.08 0.61 - 1.24 mg/dL   Calcium 8.2 (L) 8.9 - 10.3 mg/dL   GFR, Estimated >81 >10 mL/min    Comment: (NOTE) Calculated using the CKD-EPI Creatinine Equation (2021)    Anion gap 4 (L) 5 - 15    Comment: Performed at [facility], [address] [place], [place] [address]  CBC     Status: Abnormal   Collection Time: 07/25/21  4:32 AM  Result Value Ref Range   WBC 9.8 4.0 - 10.5 K/uL   RBC 4.36 4.22 - 5.81 MIL/uL   Hemoglobin 11.6 (L) 13.0 - 17.0 g/dL   HCT 58.5 (L) 92.9 - 24.4 %   MCV 82.3 80.0 - 100.0 fL   MCH 26.6 26.0 - 34.0 pg   MCHC 32.3 30.0 - 36.0 g/dL   RDW 62.8 63.8 - 17.7 %   Platelets 130 (L) 150 - 400 K/uL   nRBC 0.0 0.0 - 0.2 %    Comment: Performed at [facility], [address], [place], [place] [address]    Radiology/Results: DG UGI W SINGLE CM (SOL OR THIN BA)  Result Date: 07/25/2021 CLINICAL DATA:  50 year old male with history of recurrent large hiatal hernia, presents 1 day status post hiatal hernia repair. EXAM: WATER SOLUBLE UPPER GI SERIES TECHNIQUE: Single-column upper GI series was performed using water soluble contrast. CONTRAST:  37mL OMNIPAQUE IOHEXOL 300 MG/ML  SOLN COMPARISON:  06/18/2021 upper GI. 12/27/2020 high-resolution chest CT. FLUOROSCOPY TIME:  Fluoroscopy Time:  2 minute 36 seconds Radiation Exposure Index (if provided by the fluoroscopic device): 32.0 mGy Number of Acquired Spot Images: 4 FINDINGS: Surgical drain terminates in the left upper quadrant. Expected postoperative small amount of free air is seen under the right hemidiaphragm. Atelectasis is noted at the left greater than right lung bases. The stomach is mildly to moderately distended with gas and projects over the left heart and left lower chest, most compatible with a large  hiatal hernia. There is apparent partial twisting of the stomach similar to the preoperative upper GI study. Contrast emptied from the distal stomach into the proximal duodenum after a few minutes. No evidence of extraluminal contrast leak. The thoracic esophagus is grossly normal. No large filling defects or gross ulcers in the esophagus or stomach. IMPRESSION: 1. No evidence of extraluminal contrast leak. 2. Persistent/recurrent large hiatal hernia with apparent partial twisting of the stomach similar to the preoperative upper GI study. Mild to moderate gastric distention with gas. Water soluble contrast emptied from the distal stomach into the proximal duodenum after a few minutes. 3. Bibasilar atelectasis. These results were called by telephone at the time of interpretation on 07/25/2021 at 9:28 am to provider [facility] , who verbally acknowledged these results. Electronically  Signed   By: Delbert Phenix M.D.   On: 07/25/2021 09:43    Anti-infectives: Anti-infectives (From admission, onward)    Start     Dose/Rate Route Frequency Ordered Stop   07/24/21 2200  cefoTEtan (CEFOTAN) 2 g in sodium chloride 0.9 % 100 mL IVPB        2 g 200 mL/hr over 30 Minutes Intravenous Every 12 hours 07/24/21 1948 07/24/21 2204   07/24/21 1000  cefoTEtan (CEFOTAN) 2 g in sodium chloride 0.9 % 100 mL IVPB        2 g 200 mL/hr over 30 Minutes Intravenous On call to O.R. 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  Gastrograffin swallow showed no leak.  I reviewed and discussed with radiology.  Will begin clear liquids po.  I discussed  operative findings with him and his family particularly that I was not able to get his stomach into his abdomen and did a pexy, his diaphragm crura were stringy ( I placed one fig of 8 anteriorly), and that we should start liquids and see how he progresses.   1 Day Post-Op    LOS: 1 day   Matt B. [name], MD, [facility] [phone] to reach the surgeon on call.    07/25/2021 10:32 AM

## 2021-07-26 LAB — CBC WITH DIFFERENTIAL/PLATELET
Abs Immature Granulocytes: 0.02 10*3/uL (ref 0.00–0.07)
Basophils Absolute: 0 10*3/uL (ref 0.0–0.1)
Basophils Relative: 0 %
Eosinophils Absolute: 0 10*3/uL (ref 0.0–0.5)
Eosinophils Relative: 0 %
HCT: 36.7 % — ABNORMAL LOW (ref 39.0–52.0)
Hemoglobin: 11.9 g/dL — ABNORMAL LOW (ref 13.0–17.0)
Immature Granulocytes: 0 %
Lymphocytes Relative: 27 %
Lymphs Abs: 2.5 10*3/uL (ref 0.7–4.0)
MCH: 26.7 pg (ref 26.0–34.0)
MCHC: 32.4 g/dL (ref 30.0–36.0)
MCV: 82.3 fL (ref 80.0–100.0)
Monocytes Absolute: 1.1 10*3/uL — ABNORMAL HIGH (ref 0.1–1.0)
Monocytes Relative: 11 %
Neutro Abs: 5.7 10*3/uL (ref 1.7–7.7)
Neutrophils Relative %: 62 %
Platelets: 123 10*3/uL — ABNORMAL LOW (ref 150–400)
RBC: 4.46 MIL/uL (ref 4.22–5.81)
RDW: 15.7 % — ABNORMAL HIGH (ref 11.5–15.5)
WBC: 9.3 10*3/uL (ref 4.0–10.5)
nRBC: 0 % (ref 0.0–0.2)

## 2021-07-26 MED ORDER — OXYCODONE HCL 5 MG/5ML PO SOLN
5.0000 mg | ORAL | Status: DC | PRN
Start: 2021-07-26 — End: 2021-07-28
  Administered 2021-07-26 – 2021-07-28 (×7): 10 mg via ORAL
  Filled 2021-07-26 (×7): qty 10

## 2021-07-26 MED ORDER — ACETAMINOPHEN 10 MG/ML IV SOLN
1000.0000 mg | Freq: Four times a day (QID) | INTRAVENOUS | Status: AC | PRN
  Administered 2021-07-26 (×2): 1000 mg via INTRAVENOUS
  Filled 2021-07-26 (×2): qty 100

## 2021-07-26 NOTE — Progress Notes (Signed)
Patient ID: Raymond Watson, male   DOB: 1970-08-22, 50 y.o.   MRN: [id number] [facility] Surgery Progress Note:   2 Days Post-Op  Subjective: Mental status is clear.  Complaints sore. Objective: Vital signs in last 24 hours: Temp:  [98.6 F (37 C)-99.6 F (37.6 C)] 98.9 F (37.2 C) (12/29 0553) Pulse Rate:  [81-94] 93 (12/29 0553) Resp:  [18] 18 (12/29 0553) BP: (122-141)/(93-107) 122/98 (12/29 0553) SpO2:  [95 %-98 %] 95 % (12/29 0553)  Intake/Output from previous day: 12/28 0701 - 12/29 0700 In: 2379.4 [P.O.:600; I.V.:1379; IV Piggyback:400.3] Out: 2280 [Urine:2100; Drains:180] Intake/Output this shift: Total I/O In: 360 [P.O.:360] Out: 20 [Drains:20]  Physical Exam: Work of breathing is normal.  Having some referred pain in shoulders.  JP serous mostly.    Lab Results:  Results for orders placed or performed during the hospital encounter of 07/24/21 (from the past 48 hour(s))  CBC     Status: Abnormal   Collection Time: 07/24/21  8:38 PM  Result Value Ref Range   WBC 10.6 (H) 4.0 - 10.5 K/uL   RBC 4.49 4.22 - 5.81 MIL/uL   Hemoglobin 11.8 (L) 13.0 - 17.0 g/dL   HCT 74.0 (L) 81.4 - 48.1 %   MCV 83.7 80.0 - 100.0 fL   MCH 26.3 26.0 - 34.0 pg   MCHC 31.4 30.0 - 36.0 g/dL   RDW 85.6 31.4 - 97.0 %   Platelets 123 (L) 150 - 400 K/uL    Comment: Immature Platelet Fraction may be clinically indicated, consider ordering this additional test [id number]    nRBC 0.0 0.0 - 0.2 %    Comment: Performed at [facility], [address], [place], [place] [address]  Creatinine, serum     Status: Abnormal   Collection Time: 07/24/21  8:38 PM  Result Value Ref Range   Creatinine, Ser 1.41 (H) 0.61 - 1.24 mg/dL   GFR, Estimated 60 (L) >60 mL/min    Comment: (NOTE) Calculated using the CKD-EPI Creatinine Equation (2021) Performed at [facility], [address], [place], [place] [address]   Basic metabolic panel     Status: Abnormal    Collection Time: 07/25/21  4:32 AM  Result Value Ref Range   Sodium 136 135 - 145 mmol/L   Potassium 4.5 3.5 - 5.1 mmol/L   Chloride 107 98 - 111 mmol/L   CO2 25 22 - 32 mmol/L   Glucose, Bld 144 (H) 70 - 99 mg/dL    Comment: Glucose reference range applies only to samples taken after fasting for at least 8 hours.   BUN 16 6 - 20 mg/dL   Creatinine, Ser 8.67 0.61 - 1.24 mg/dL   Calcium 8.2 (L) 8.9 - 10.3 mg/dL   GFR, Estimated >67 >20 mL/min    Comment: (NOTE) Calculated using the CKD-EPI Creatinine Equation (2021)    Anion gap 4 (L) 5 - 15    Comment: Performed at [facility], [address], [place], [place] [address]  CBC     Status: Abnormal   Collection Time: 07/25/21  4:32 AM  Result Value Ref Range   WBC 9.8 4.0 - 10.5 K/uL   RBC 4.36 4.22 - 5.81 MIL/uL   Hemoglobin 11.6 (L) 13.0 - 17.0 g/dL   HCT 62.8 (L) 36.6 - 29.4 %   MCV 82.3 80.0 - 100.0 fL   MCH 26.6 26.0 - 34.0 pg   MCHC 32.3 30.0 - 36.0 g/dL  RDW 15.5 11.5 - 15.5 %   Platelets 130 (L) 150 - 400 K/uL   nRBC 0.0 0.0 - 0.2 %    Comment: Performed at [facility], [address] [place], [place] [address]  CBC with Differential/Platelet     Status: Abnormal   Collection Time: 07/26/21  4:09 AM  Result Value Ref Range   WBC 9.3 4.0 - 10.5 K/uL   RBC 4.46 4.22 - 5.81 MIL/uL   Hemoglobin 11.9 (L) 13.0 - 17.0 g/dL   HCT 13.0 (L) 86.5 - 78.4 %   MCV 82.3 80.0 - 100.0 fL   MCH 26.7 26.0 - 34.0 pg   MCHC 32.4 30.0 - 36.0 g/dL   RDW 69.6 (H) 29.5 - 28.4 %   Platelets 123 (L) 150 - 400 K/uL    Comment: Immature Platelet Fraction may be clinically indicated, consider ordering this additional test [id number] REPEATED TO VERIFY    nRBC 0.0 0.0 - 0.2 %   Neutrophils Relative % 62 %   Neutro Abs 5.7 1.7 - 7.7 K/uL   Lymphocytes Relative 27 %   Lymphs Abs 2.5 0.7 - 4.0 K/uL   Monocytes Relative 11 %   Monocytes Absolute 1.1 (H) 0.1 - 1.0 K/uL   Eosinophils Relative 0 %    Eosinophils Absolute 0.0 0.0 - 0.5 K/uL   Basophils Relative 0 %   Basophils Absolute 0.0 0.0 - 0.1 K/uL   Immature Granulocytes 0 %   Abs Immature Granulocytes 0.02 0.00 - 0.07 K/uL    Comment: Performed at [facility], [address] [place], [place] [address]    Radiology/Results: DG UGI W SINGLE CM (SOL OR THIN BA)  Result Date: 07/25/2021 CLINICAL DATA:  50 year old male with history of recurrent large hiatal hernia, presents 1 day status post hiatal hernia repair. EXAM: WATER SOLUBLE UPPER GI SERIES TECHNIQUE: Single-column upper GI series was performed using water soluble contrast. CONTRAST:  62mL OMNIPAQUE IOHEXOL 300 MG/ML  SOLN COMPARISON:  06/18/2021 upper GI. 12/27/2020 high-resolution chest CT. FLUOROSCOPY TIME:  Fluoroscopy Time:  2 minute 36 seconds Radiation Exposure Index (if provided by the fluoroscopic device): 32.0 mGy Number of Acquired Spot Images: 4 FINDINGS: Surgical drain terminates in the left upper quadrant. Expected postoperative small amount of free air is seen under the right hemidiaphragm. Atelectasis is noted at the left greater than right lung bases. The stomach is mildly to moderately distended with gas and projects over the left heart and left lower chest, most compatible with a large hiatal hernia. There is apparent partial twisting of the stomach similar to the preoperative upper GI study. Contrast emptied from the distal stomach into the proximal duodenum after a few minutes. No evidence of extraluminal contrast leak. The thoracic esophagus is grossly normal. No large filling defects or gross ulcers in the esophagus or stomach. IMPRESSION: 1. No evidence of extraluminal contrast leak. 2. Persistent/recurrent large hiatal hernia with apparent partial twisting of the stomach similar to the preoperative upper GI study. Mild to moderate gastric distention with gas. Water soluble contrast emptied from the distal stomach into the proximal duodenum  after a few minutes. 3. Bibasilar atelectasis. These results were called by telephone at the time of interpretation on 07/25/2021 at 9:28 am to provider [facility] , who verbally acknowledged these results. Electronically Signed   By: Delbert Phenix M.D.   On: 07/25/2021 09:43    Anti-infectives: Anti-infectives (From admission, onward)    Start  Dose/Rate Route Frequency Ordered Stop   07/24/21 2200  cefoTEtan (CEFOTAN) 2 g in sodium chloride 0.9 % 100 mL IVPB        2 g 200 mL/hr over 30 Minutes Intravenous Every 12 hours 07/24/21 1948 07/24/21 2204   07/24/21 1000  cefoTEtan (CEFOTAN) 2 g in sodium chloride 0.9 % 100 mL IVPB        2 g 200 mL/hr over 30 Minutes Intravenous On call to O.R. 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  Will advance to soft diet.  Check lab in am 2 Days Post-Op    LOS: 2 days   Matt B. [name], MD, [facility] [phone] to reach the surgeon on call.    07/26/2021 10:43 AM

## 2021-07-27 LAB — CBC WITH DIFFERENTIAL/PLATELET
Abs Immature Granulocytes: 0.04 10*3/uL (ref 0.00–0.07)
Basophils Absolute: 0 10*3/uL (ref 0.0–0.1)
Basophils Relative: 0 %
Eosinophils Absolute: 0.1 10*3/uL (ref 0.0–0.5)
Eosinophils Relative: 1 %
HCT: 37.3 % — ABNORMAL LOW (ref 39.0–52.0)
Hemoglobin: 11.9 g/dL — ABNORMAL LOW (ref 13.0–17.0)
Immature Granulocytes: 0 %
Lymphocytes Relative: 20 %
Lymphs Abs: 1.9 10*3/uL (ref 0.7–4.0)
MCH: 26.2 pg (ref 26.0–34.0)
MCHC: 31.9 g/dL (ref 30.0–36.0)
MCV: 82 fL (ref 80.0–100.0)
Monocytes Absolute: 1.1 10*3/uL — ABNORMAL HIGH (ref 0.1–1.0)
Monocytes Relative: 11 %
Neutro Abs: 6.5 10*3/uL (ref 1.7–7.7)
Neutrophils Relative %: 68 %
Platelets: 124 10*3/uL — ABNORMAL LOW (ref 150–400)
RBC: 4.55 MIL/uL (ref 4.22–5.81)
RDW: 15.2 % (ref 11.5–15.5)
WBC: 9.5 10*3/uL (ref 4.0–10.5)
nRBC: 0 % (ref 0.0–0.2)

## 2021-07-27 MED ORDER — ACETAMINOPHEN 160 MG/5ML PO SOLN
650.0000 mg | Freq: Four times a day (QID) | ORAL | Status: DC | PRN
  Administered 2021-07-27: 19:00:00 650 mg via ORAL
  Filled 2021-07-27: qty 20.3

## 2021-07-27 MED ORDER — DOCUSATE SODIUM 100 MG PO CAPS
100.0000 mg | ORAL_CAPSULE | Freq: Two times a day (BID) | ORAL | Status: DC
  Administered 2021-07-27 – 2021-07-28 (×2): 100 mg via ORAL
  Filled 2021-07-27 (×2): qty 1

## 2021-07-27 MED ORDER — BISACODYL 10 MG RE SUPP
10.0000 mg | Freq: Once | RECTAL | Status: AC
  Administered 2021-07-27: 19:00:00 10 mg via RECTAL
  Filled 2021-07-27: qty 1

## 2021-07-28 MED ORDER — OXYCODONE HCL 5 MG/5ML PO SOLN: 5.0000 mg | ORAL | 0 refills | Status: DC | PRN

## 2021-07-28 MED ORDER — OXYCODONE-ACETAMINOPHEN 5-325 MG PO TABS: 1.0000 | ORAL_TABLET | ORAL | 0 refills | Status: DC | PRN

## 2021-07-28 MED ORDER — OXYCODONE-ACETAMINOPHEN 5-325 MG PO TABS
1.0000 | ORAL_TABLET | ORAL | Status: DC | PRN
Start: 2021-07-28 — End: 2021-07-28

## 2021-07-28 MED ORDER — ONDANSETRON 4 MG PO TBDP: 4.0000 mg | ORAL_TABLET | Freq: Three times a day (TID) | ORAL | 5 refills | Status: DC | PRN

## 2021-07-28 NOTE — Discharge Instructions (Signed)
EATING AFTER YOUR ESOPHAGEAL SURGERY (Stomach Fundoplication, Hiatal Hernia repair, Achalasia surgery, etc)  ######################################################################  EAT Start with a pureed / full liquid diet (see below) Gradually transition to a high fiber diet with a fiber supplement over the next month after discharge.    WALK Walk an hour a day.  Control your pain to do that.    CONTROL PAIN Control pain so that you can walk, sleep, tolerate sneezing/coughing, go up/down stairs.  HAVE A BOWEL MOVEMENT DAILY Keep your bowels regular to avoid problems.  OK to try a laxative to override constipation.  OK to use an antidairrheal to slow down diarrhea.  Call if not better after 2 tries  CALL IF YOU HAVE PROBLEMS/CONCERNS Call if you are still struggling despite following these instructions. Call if you have concerns not answered by these instructions  ######################################################################   After your esophageal surgery, expect some sticking with swallowing over the next 1-2 months.    If food sticks when you eat, it is called "dysphagia".  This is due to swelling around your esophagus at the wrap & hiatal diaphragm repair.  It will gradually ease off over the next few months.  To help you through this temporary phase, we start you out on a pureed (blenderized) diet.  Your first meal in the hospital was thin liquids.  You should have been given a pureed diet by the time you left the hospital.  We ask patients to stay on a pureed diet for the first 2-3 weeks to avoid anything getting "stuck" near your recent surgery.  Don't be alarmed if your ability to swallow doesn't progress according to this plan.  Everyone is different and some diets can advance more or less quickly.    It is often helpful to crush your medications or split them as they can sometimes stick, especially the first week or so.   Some BASIC RULES to follow  are:  Maintain an upright position whenever eating or drinking.  Take small bites - just a teaspoon size bite at a time.  Eat slowly.  It may also help to eat only one food at a time.  Consider nibbling through smaller, more frequent meals & avoid the urge to eat BIG meals  Do not push through feelings of fullness, nausea, or bloatedness  Do not mix solid foods and liquids in the same mouthful  Try not to "wash foods down" with large gulps of liquids.  Avoid carbonated (bubbly/fizzy) drinks.    Avoid foods that make you feel gassy or bloated.  Start with bland foods first.  Wait on trying greasy, fried, or spicy meals until you are tolerating more bland solids well.  Understand that it will be hard to burp and belch at first.  This gradually improves with time.  Expect to be more gassy/flatulent/bloated initially.  Walking will help your body manage it better.  Consider using medications for bloating that contain simethicone such as  Maalox or Gas-X   Consider crushing her medications, especially smaller pills.  The ability to swallow pills should get easier after a few weeks  Eat in a relaxed atmosphere & minimize distractions.  Avoid talking while eating.    Do not use straws.  Following each meal, sit in an upright position (90 degree angle) for 60 to 90 minutes.  Going for a short walk can help as well  If food does stick, don't panic.  Try to relax and let the food pass on its own.    Sipping WARM LIQUID such as strong hot black tea can also help slide it down.   Be gradual in changes & use common sense:  -If you easily tolerating a certain "level" of foods, advance to the next level gradually -If you are having trouble swallowing a particular food, then avoid it.   -If food is sticking when you advance your diet, go back to thinner previous diet (the lower LEVEL) for 1-2 days.  LEVEL 1 = PUREED DIET  Do for the first 2 WEEKS AFTER SURGERY  -Foods in this group are  pureed or blenderized to a smooth, mashed potato-like consistency.  -If necessary, the pureed foods can keep their shape with the addition of a thickening agent.   -Meat should be pureed to a smooth, pasty consistency.  Hot broth or gravy may be added to the pureed meat, approximately 1 oz. of liquid per 3 oz. serving of meat. -CAUTION:  If any foods do not puree into a smooth consistency, swallowing will be more difficult.  (For example, nuts or seeds sometimes do not blend well.)  Hot Foods Cold Foods  Pureed scrambled eggs and cheese Pureed cottage cheese  Baby cereals Thickened juices and nectars  Thinned cooked cereals (no lumps) Thickened milk or eggnog  Pureed French toast or pancakes Ensure  Mashed potatoes Ice cream  Pureed parsley, au gratin, scalloped potatoes, candied sweet potatoes Fruit or Italian ice, sherbet  Pureed buttered or alfredo noodles Plain yogurt  Pureed vegetables (no corn or peas) Instant breakfast  Pureed soups and creamed soups Smooth pudding, mousse, custard  Pureed scalloped apples Whipped gelatin  Gravies Sugar, syrup, honey, jelly  Sauces, cheese, tomato, barbecue, white, creamed Cream  Any baby food Creamer  Alcohol in moderation (not beer or champagne) Margarine  Coffee or tea Mayonnaise   Ketchup, mustard   Apple sauce   SAMPLE MENU:  PUREED DIET Breakfast Lunch Dinner   Orange juice, 1/2 cup  Cream of wheat, 1/2 cup  Pineapple juice, 1/2 cup  Pureed turkey, barley soup, 3/4 cup  Pureed Hawaiian chicken, 3 oz   Scrambled eggs, mashed or blended with cheese, 1/2 cup  Tea or coffee, 1 cup   Whole milk, 1 cup   Non-dairy creamer, 2 Tbsp.  Mashed potatoes, 1/2 cup  Pureed cooled broccoli, 1/2 cup  Apple sauce, 1/2 cup  Coffee or tea  Mashed potatoes, 1/2 cup  Pureed spinach, 1/2 cup  Frozen yogurt, 1/2 cup  Tea or coffee      LEVEL 2 = SOFT DIET  After your first 2 weeks, you can advance to a soft diet.   Keep on this  diet until everything goes down easily.  Hot Foods Cold Foods  White fish Cottage cheese  Stuffed fish Junior baby fruit  Baby food meals Semi thickened juices  Minced soft cooked, scrambled, poached eggs nectars  Souffle & omelets Ripe mashed bananas  Cooked cereals Canned fruit, pineapple sauce, milk  potatoes Milkshake  Buttered or Alfredo noodles Custard  Cooked cooled vegetable Puddings, including tapioca  Sherbet Yogurt  Vegetable soup or alphabet soup Fruit ice, Italian ice  Gravies Whipped gelatin  Sugar, syrup, honey, jelly Junior baby desserts  Sauces:  Cheese, creamed, barbecue, tomato, white Cream  Coffee or tea Margarine   SAMPLE MENU:  LEVEL 2 Breakfast Lunch Dinner   Orange juice, 1/2 cup  Oatmeal, 1/2 cup  Scrambled eggs with cheese, 1/2 cup  Decaffeinated tea, 1 cup  Whole milk, 1 cup    Non-dairy creamer, 2 Tbsp  Pineapple juice, 1/2 cup  Minced beef, 3 oz  Gravy, 2 Tbsp  Mashed potatoes, 1/2 cup  Minced fresh broccoli, 1/2 cup  Applesauce, 1/2 cup  Coffee, 1 cup  Turkey, barley soup, 3/4 cup  Minced Hawaiian chicken, 3 oz  Mashed potatoes, 1/2 cup  Cooked spinach, 1/2 cup  Frozen yogurt, 1/2 cup  Non-dairy creamer, 2 Tbsp      LEVEL 3 = CHOPPED DIET  -After all the foods in level 2 (soft diet) are passing through well you should advance up to more chopped foods.  -It is still important to cut these foods into small pieces and eat slowly.  Hot Foods Cold Foods  Poultry Cottage cheese  Chopped Swedish meatballs Yogurt  Meat salads (ground or flaked meat) Milk  Flaked fish (tuna) Milkshakes  Poached or scrambled eggs Soft, cold, dry cereal  Souffles and omelets Fruit juices or nectars  Cooked cereals Chopped canned fruit  Chopped French toast or pancakes Canned fruit cocktail  Noodles or pasta (no rice) Pudding, mousse, custard  Cooked vegetables (no frozen peas, corn, or mixed vegetables) Green salad  Canned small sweet peas  Ice cream  Creamed soup or vegetable soup Fruit ice, Italian ice  Pureed vegetable soup or alphabet soup Non-dairy creamer  Ground scalloped apples Margarine  Gravies Mayonnaise  Sauces:  Cheese, creamed, barbecue, tomato, white Ketchup  Coffee or tea Mustard   SAMPLE MENU:  LEVEL 3 Breakfast Lunch Dinner   Orange juice, 1/2 cup  Oatmeal, 1/2 cup  Scrambled eggs with cheese, 1/2 cup  Decaffeinated tea, 1 cup  Whole milk, 1 cup  Non-dairy creamer, 2 Tbsp  Ketchup, 1 Tbsp  Margarine, 1 tsp  Salt, 1/4 tsp  Sugar, 2 tsp  Pineapple juice, 1/2 cup  Ground beef, 3 oz  Gravy, 2 Tbsp  Mashed potatoes, 1/2 cup  Cooked spinach, 1/2 cup  Applesauce, 1/2 cup  Decaffeinated coffee  Whole milk  Non-dairy creamer, 2 Tbsp  Margarine, 1 tsp  Salt, 1/4 tsp  Pureed turkey, barley soup, 3/4 cup  Barbecue chicken, 3 oz  Mashed potatoes, 1/2 cup  Ground fresh broccoli, 1/2 cup  Frozen yogurt, 1/2 cup  Decaffeinated tea, 1 cup  Non-dairy creamer, 2 Tbsp  Margarine, 1 tsp  Salt, 1/4 tsp  Sugar, 1 tsp    LEVEL 4:  REGULAR FOODS  -Foods in this group are soft, moist, regularly textured foods.   -This level includes meat and breads, which tend to be the hardest things to swallow.   -Eat very slowly, chew well and continue to avoid carbonated drinks. -most people are at this level in 4-6 weeks  Hot Foods Cold Foods  Baked fish or skinned Soft cheeses - cottage cheese  Souffles and omelets Cream cheese  Eggs Yogurt  Stuffed shells Milk  Spaghetti with meat sauce Milkshakes  Cooked cereal Cold dry cereals (no nuts, dried fruit, coconut)  French toast or pancakes Crackers  Buttered toast Fruit juices or nectars  Noodles or pasta (no rice) Canned fruit  Potatoes (all types) Ripe bananas  Soft, cooked vegetables (no corn, lima, or baked beans) Peeled, ripe, fresh fruit  Creamed soups or vegetable soup Cakes (no nuts, dried fruit, coconut)  Canned chicken  noodle soup Plain doughnuts  Gravies Ice cream  Bacon dressing Pudding, mousse, custard  Sauces:  Cheese, creamed, barbecue, tomato, white Fruit ice, Italian ice, sherbet  Decaffeinated tea or coffee Whipped gelatin  Pork chops Regular gelatin     Canned fruited gelatin molds   Sugar, syrup, honey, jam, jelly   Cream   Non-dairy   Margarine   Oil   Mayonnaise   Ketchup   Mustard   TROUBLESHOOTING IRREGULAR BOWELS  1) Avoid extremes of bowel movements (no bad constipation/diarrhea)  2) Miralax 17gm mixed in 8oz. water or juice-daily. May use BID as needed.  3) Gas-x,Phazyme, etc. as needed for gas & bloating.  4) Soft,bland diet. No spicy,greasy,fried foods.  5) Prilosec over-the-counter as needed  6) May hold gluten/wheat products from diet to see if symptoms improve.  7) May try probiotics (Align, Activa, etc) to help calm the bowels down  7) If symptoms become worse call back immediately.    If you have any questions please call our office at [facility]: [phone].  

## 2021-07-28 NOTE — Progress Notes (Signed)
Assessment unchanged. Pt and wife verbalized understanding of dc instructions including medications, follow up care and when to call the doctor. Discharged via wc to front entrance accompanied.

## 2021-07-28 NOTE — TOC Transition Note (Signed)
Transition of Care [facility]) - CM/SW Discharge Note   Patient Details  Name: Raymond Watson MRN: [id number] Date of Birth: Nov 19, 1970  [facility] CM/SW Contact:  Golda Acre, RN Phone Number: 07/28/2021, 9:18 AM   Clinical Narrative:    Pt dcd to home with seld care.  Orders checked for toc needs none found   Final next level of care: Home/Self Care Barriers to Discharge: No Barriers Identified   Patient Goals and CMS Choice Patient states their goals for this hospitalization and ongoing recovery are:: to go home CMS Medicare.gov Compare Post Acute Care list provided to:: Patient    Discharge Placement                       Discharge Plan and Services   Discharge Planning Services: CM Consult                                 Social Determinants of Health (SDOH) Interventions     Readmission Risk Interventions No flowsheet data found.

## 2021-07-28 NOTE — Discharge Summary (Signed)
Physician Discharge Summary  Patient ID: Raymond Watson MRN: [id number] DOB/AGE: 52-20-1972 52 y.o.  PCP: Kirstie Peri, MD  Admit date: 07/24/2021 Discharge date: 07/28/2021  Admission Diagnoses:  recurrent hiatal hernia  Discharge Diagnoses:  same  Principal Problem:   History of repair of hiatal hernia   Surgery:  attempted repair with stomach trapped in mediastinum.  gastropexy  Discharged Condition: improved  Hospital Course:   had surgery.  UGI obtained and po intake begun slowly.  Advanced until ready for discharge  Consults: none  Significant Diagnostic Studies: UGI    Discharge Exam: Blood pressure (!) 131/105, pulse 93, temperature 98 F (36.7 C), temperature source Oral, resp. rate 19, height 5\' 4"  (1.626 m), weight 63.7 kg, SpO2 98 %. Incisions OK  Disposition: Discharge disposition: 01-Home or Self Care       Discharge Instructions     Call MD for:  difficulty breathing, headache or visual disturbances   Complete by: As directed    Call MD for:  extreme fatigue   Complete by: As directed    Call MD for:  hives   Complete by: As directed    Call MD for:  persistant dizziness or light-headedness   Complete by: As directed    Call MD for:  persistant nausea and vomiting   Complete by: As directed    Call MD for:  redness, tenderness, or signs of infection (pain, swelling, redness, odor or green/yellow discharge around incision site)   Complete by: As directed    Call MD for:  severe uncontrolled pain   Complete by: As directed    Call MD for:  temperature >100.4   Complete by: As directed    Diet - low sodium heart healthy   Complete by: As directed    Discharge instructions   Complete by: As directed    May shower. No heavy lifting. Soft foods   Increase activity slowly   Complete by: As directed    Increase activity slowly   Complete by: As directed    No wound care   Complete by: As directed       Allergies as of 07/28/2021   No Known  Allergies      Medication List     TAKE these medications    albuterol 108 (90 Base) MCG/ACT inhaler Commonly known as: VENTOLIN HFA Inhale 2 puffs into the lungs every 6 (six) hours as needed for wheezing or shortness of breath.   amLODipine 2.5 MG tablet Commonly known as: NORVASC Take 2.5 mg by mouth daily.   budesonide-formoterol 80-4.5 MCG/ACT inhaler Commonly known as: Symbicort Take 2 puffs first thing in am and then another 2 puffs about 12 hours later.   cholecalciferol 25 MCG (1000 UNIT) tablet Commonly known as: VITAMIN D Take 1,000 Units by mouth in the morning.   multivitamin with minerals Tabs tablet Take 1 tablet by mouth in the morning. One-A-Day for Men 50+   oxyCODONE 5 MG/5ML solution Commonly known as: ROXICODONE Take 5-10 mLs (5-10 mg total) by mouth every 4 (four) hours as needed for moderate pain or severe pain (5 mg for moderate pain, 10 mg for severe pain).   oxyCODONE-acetaminophen 5-325 MG tablet Commonly known as: PERCOCET/ROXICET Take 1 tablet by mouth every 4 (four) hours as needed for moderate pain.   rosuvastatin 5 MG tablet Commonly known as: CRESTOR Take 5 mg by mouth at bedtime.   Zinc 50 MG Tabs Take 50 mg by mouth in the morning.  Follow-up Information     Luretha Murphy, MD Follow up in 2 week(s).   Specialty: General Surgery Contact information: [address] [place] [address] [phone]                 Signed: Valarie Merino 07/28/2021, 10:23 AM

## 2021-07-28 NOTE — Progress Notes (Signed)
4 Days Post-Op   Subjective/Chief Complaint: Complains of some soreness but wants to go home. Only ate a small amount   Objective: Vital signs in last 24 hours: Temp:  [98 F (36.7 C)-99.3 F (37.4 C)] 98 F (36.7 C) (12/31 0629) Pulse Rate:  [93-109] 93 (12/31 0629) Resp:  [14-19] 19 (12/31 0629) BP: (126-131)/(96-105) 131/105 (12/31 0629) SpO2:  [97 %-98 %] 98 % (12/31 0629) Last BM Date: 07/23/21  Intake/Output from previous day: 12/30 0701 - 12/31 0700 In: 2758 [P.O.:1560; I.V.:1198] Out: 300 [Urine:300] Intake/Output this shift: No intake/output data recorded.  General appearance: alert and cooperative Resp: clear to auscultation bilaterally Cardio: regular rate and rhythm GI: soft, mild tenderness. Incisions look good. Good bs  Lab Results:  Recent Labs    07/26/21 0409 07/27/21 0438  WBC 9.3 9.5  HGB 11.9* 11.9*  HCT 36.7* 37.3*  PLT 123* 124*   BMET No results for input(s): NA, K, CL, CO2, GLUCOSE, BUN, CREATININE, CALCIUM in the last 72 hours. PT/INR No results for input(s): LABPROT, INR in the last 72 hours. ABG No results for input(s): PHART, HCO3 in the last 72 hours.  Invalid input(s): PCO2, PO2  Studies/Results: No results found.  Anti-infectives: Anti-infectives (From admission, onward)    Start     Dose/Rate Route Frequency Ordered Stop   07/24/21 2200  cefoTEtan (CEFOTAN) 2 g in sodium chloride 0.9 % 100 mL IVPB        2 g 200 mL/hr over 30 Minutes Intravenous Every 12 hours 07/24/21 1948 07/24/21 2204   07/24/21 1000  cefoTEtan (CEFOTAN) 2 g in sodium chloride 0.9 % 100 mL IVPB        2 g 200 mL/hr over 30 Minutes Intravenous On call to O.R. 07/24/21 0948 07/24/21 1244       Assessment/Plan: s/p Procedure(s): XI ROBOTIC REDO ASSISTED TAKE DOWN INCARCERATED HERNIA WITH GASTROPEXY (N/A) UPPER GI ENDOSCOPY (N/A) Advance diet. Soft diet Ambulate Plan for d/c today  LOS: 4 days    Chevis Pretty III 07/28/2021

## 2021-07-28 NOTE — Plan of Care (Signed)

## 2021-07-29 ENCOUNTER — Encounter (HOSPITAL_COMMUNITY): Payer: Self-pay

## 2021-07-29 ENCOUNTER — Inpatient Hospital Stay (HOSPITAL_COMMUNITY): Payer: BC Managed Care – PPO

## 2021-07-29 ENCOUNTER — Other Ambulatory Visit: Payer: Self-pay

## 2021-07-29 ENCOUNTER — Inpatient Hospital Stay (HOSPITAL_COMMUNITY)
Admission: EM | Admit: 2021-07-29 | Discharge: 2021-08-07 | Disposition: A | Discharge status: Other Acute Inpatient Hospital | Payer: BC Managed Care – PPO | Source: Home / Self Care | Attending: Surgery | Admitting: Surgery

## 2021-07-29 DIAGNOSIS — Z8249 Family history of ischemic heart disease and other diseases of the circulatory system: Secondary | ICD-10-CM

## 2021-07-29 DIAGNOSIS — Z87442 Personal history of urinary calculi: Secondary | ICD-10-CM

## 2021-07-29 DIAGNOSIS — Z9889 Other specified postprocedural states: Secondary | ICD-10-CM

## 2021-07-29 DIAGNOSIS — I10 Essential (primary) hypertension: Secondary | ICD-10-CM | POA: Diagnosis present

## 2021-07-29 DIAGNOSIS — R112 Nausea with vomiting, unspecified: Secondary | ICD-10-CM

## 2021-07-29 DIAGNOSIS — K562 Volvulus: Secondary | ICD-10-CM

## 2021-07-29 DIAGNOSIS — E440 Moderate protein-calorie malnutrition: Secondary | ICD-10-CM | POA: Insufficient documentation

## 2021-07-29 DIAGNOSIS — Z825 Family history of asthma and other chronic lower respiratory diseases: Secondary | ICD-10-CM

## 2021-07-29 DIAGNOSIS — Z6824 Body mass index (BMI) 24.0-24.9, adult: Secondary | ICD-10-CM

## 2021-07-29 DIAGNOSIS — Z8616 Personal history of COVID-19: Secondary | ICD-10-CM

## 2021-07-29 DIAGNOSIS — E785 Hyperlipidemia, unspecified: Secondary | ICD-10-CM | POA: Diagnosis present

## 2021-07-29 DIAGNOSIS — R079 Chest pain, unspecified: Secondary | ICD-10-CM

## 2021-07-29 DIAGNOSIS — Z751 Person awaiting admission to adequate facility elsewhere: Secondary | ICD-10-CM

## 2021-07-29 DIAGNOSIS — Z7951 Long term (current) use of inhaled steroids: Secondary | ICD-10-CM

## 2021-07-29 DIAGNOSIS — K219 Gastro-esophageal reflux disease without esophagitis: Secondary | ICD-10-CM | POA: Diagnosis present

## 2021-07-29 DIAGNOSIS — K3189 Other diseases of stomach and duodenum: Secondary | ICD-10-CM | POA: Diagnosis present

## 2021-07-29 DIAGNOSIS — R071 Chest pain on breathing: Secondary | ICD-10-CM

## 2021-07-29 DIAGNOSIS — K449 Diaphragmatic hernia without obstruction or gangrene: Secondary | ICD-10-CM

## 2021-07-29 DIAGNOSIS — Z20822 Contact with and (suspected) exposure to covid-19: Secondary | ICD-10-CM | POA: Diagnosis present

## 2021-07-29 HISTORY — DX: Other specified postprocedural states: Z98.890

## 2021-07-29 HISTORY — DX: Nausea with vomiting, unspecified: R11.2

## 2021-07-29 LAB — CBC WITH DIFFERENTIAL/PLATELET
Abs Immature Granulocytes: 0.04 10*3/uL (ref 0.00–0.07)
Basophils Absolute: 0 10*3/uL (ref 0.0–0.1)
Basophils Relative: 0 %
Eosinophils Absolute: 0 10*3/uL (ref 0.0–0.5)
Eosinophils Relative: 0 %
HCT: 46.6 % (ref 39.0–52.0)
Hemoglobin: 15.1 g/dL (ref 13.0–17.0)
Immature Granulocytes: 0 %
Lymphocytes Relative: 12 %
Lymphs Abs: 1.1 10*3/uL (ref 0.7–4.0)
MCH: 26.4 pg (ref 26.0–34.0)
MCHC: 32.4 g/dL (ref 30.0–36.0)
MCV: 81.5 fL (ref 80.0–100.0)
Monocytes Absolute: 0.5 10*3/uL (ref 0.1–1.0)
Monocytes Relative: 6 %
Neutro Abs: 7.9 10*3/uL — ABNORMAL HIGH (ref 1.7–7.7)
Neutrophils Relative %: 82 %
Platelets: 210 10*3/uL (ref 150–400)
RBC: 5.72 MIL/uL (ref 4.22–5.81)
RDW: 15.4 % (ref 11.5–15.5)
WBC: 9.7 10*3/uL (ref 4.0–10.5)
nRBC: 0 % (ref 0.0–0.2)

## 2021-07-29 LAB — COMPREHENSIVE METABOLIC PANEL
ALT: 23 U/L (ref 0–44)
AST: 24 U/L (ref 15–41)
Albumin: 3.9 g/dL (ref 3.5–5.0)
Alkaline Phosphatase: 71 U/L (ref 38–126)
Anion gap: 7 (ref 5–15)
BUN: 26 mg/dL — ABNORMAL HIGH (ref 6–20)
CO2: 25 mmol/L (ref 22–32)
Calcium: 8.7 mg/dL — ABNORMAL LOW (ref 8.9–10.3)
Chloride: 104 mmol/L (ref 98–111)
Creatinine, Ser: 1.27 mg/dL — ABNORMAL HIGH (ref 0.61–1.24)
GFR, Estimated: >60 mL/min (ref >60)
Glucose, Bld: 116 mg/dL — ABNORMAL HIGH (ref 70–99)
Potassium: 4 mmol/L (ref 3.5–5.1)
Sodium: 136 mmol/L (ref 135–145)
Total Bilirubin: 1.1 mg/dL (ref 0.3–1.2)
Total Protein: 7.5 g/dL (ref 6.5–8.1)

## 2021-07-29 LAB — I-STAT CHEM 8, ED
BUN: 33 mg/dL — ABNORMAL HIGH (ref 6–20)
Calcium, Ion: 1.03 mmol/L — ABNORMAL LOW (ref 1.15–1.40)
Chloride: 103 mmol/L (ref 98–111)
Creatinine, Ser: 1.3 mg/dL — ABNORMAL HIGH (ref 0.61–1.24)
Glucose, Bld: 111 mg/dL — ABNORMAL HIGH (ref 70–99)
HCT: 47 % (ref 39.0–52.0)
Hemoglobin: 16 g/dL (ref 13.0–17.0)
Potassium: 4.6 mmol/L (ref 3.5–5.1)
Sodium: 134 mmol/L — ABNORMAL LOW (ref 135–145)
TCO2: 25 mmol/L (ref 22–32)

## 2021-07-29 LAB — LACTIC ACID, PLASMA
Lactic Acid, Venous: 2.6 mmol/L (ref 0.5–1.9)
Lactic Acid, Venous: 1.9 mmol/L (ref 0.5–1.9)

## 2021-07-29 LAB — I-STAT BETA HCG BLOOD, ED (MC, WL, AP ONLY): I-stat hCG, quantitative: <5 m[IU]/mL (ref <5)

## 2021-07-29 LAB — LIPASE, BLOOD: Lipase: 26 U/L (ref 11–51)

## 2021-07-29 LAB — TROPONIN I (HIGH SENSITIVITY): Troponin I (High Sensitivity): 11 ng/L (ref <18)

## 2021-07-29 MED ORDER — IOHEXOL 350 MG/ML SOLN
80.0000 mL | Freq: Once | INTRAVENOUS | Status: AC | PRN
  Administered 2021-07-29: 80 mL via INTRAVENOUS

## 2021-07-29 MED ORDER — KCL IN DEXTROSE-NACL 20-5-0.9 MEQ/L-%-% IV SOLN
INTRAVENOUS | Status: AC
  Filled 2021-07-29 (×4): qty 1000

## 2021-07-29 MED ORDER — HYDROMORPHONE HCL 1 MG/ML IJ SOLN
1.0000 mg | Freq: Once | INTRAMUSCULAR | Status: AC
  Administered 2021-07-29: 1 mg via INTRAVENOUS
  Filled 2021-07-29: qty 1

## 2021-07-29 MED ORDER — ENOXAPARIN SODIUM 40 MG/0.4ML IJ SOSY
40.0000 mg | PREFILLED_SYRINGE | INTRAMUSCULAR | Status: DC
  Administered 2021-07-30 – 2021-08-06 (×9): 40 mg via SUBCUTANEOUS
  Filled 2021-07-29 (×9): qty 0.4

## 2021-07-29 MED ORDER — SODIUM CHLORIDE 0.9 % IV BOLUS
500.0000 mL | Freq: Once | INTRAVENOUS | Status: AC
  Administered 2021-07-29: 500 mL via INTRAVENOUS

## 2021-07-29 MED ORDER — ONDANSETRON HCL 4 MG/2ML IJ SOLN
4.0000 mg | Freq: Four times a day (QID) | INTRAMUSCULAR | Status: DC | PRN
  Administered 2021-07-29: 4 mg via INTRAVENOUS
  Filled 2021-07-29: qty 2

## 2021-07-29 MED ORDER — ONDANSETRON HCL 4 MG/2ML IJ SOLN
4.0000 mg | Freq: Once | INTRAMUSCULAR | Status: AC
  Administered 2021-07-29: 4 mg via INTRAVENOUS
  Filled 2021-07-29: qty 2

## 2021-07-29 MED ORDER — PANTOPRAZOLE SODIUM 40 MG IV SOLR
40.0000 mg | Freq: Every day | INTRAVENOUS | Status: DC
  Administered 2021-07-29 – 2021-08-06 (×9): 40 mg via INTRAVENOUS
  Filled 2021-07-29 (×9): qty 40

## 2021-07-29 MED ORDER — ONDANSETRON 4 MG PO TBDP: 4.0000 mg | ORAL_TABLET | Freq: Four times a day (QID) | ORAL | Status: DC | PRN

## 2021-07-29 MED ORDER — MOMETASONE FURO-FORMOTEROL FUM 100-5 MCG/ACT IN AERO
2.0000 | INHALATION_SPRAY | Freq: Two times a day (BID) | RESPIRATORY_TRACT | Status: DC
  Administered 2021-07-30 – 2021-08-07 (×16): 2 via RESPIRATORY_TRACT
  Filled 2021-07-29 (×2): qty 8.8

## 2021-07-29 MED ORDER — FENTANYL CITRATE PF 50 MCG/ML IJ SOSY
25.0000 ug | PREFILLED_SYRINGE | INTRAMUSCULAR | Status: DC | PRN
  Administered 2021-07-30: 50 ug via INTRAVENOUS
  Administered 2021-07-30 (×2): 25 ug via INTRAVENOUS
  Administered 2021-07-30: 50 ug via INTRAVENOUS
  Administered 2021-07-30: 25 ug via INTRAVENOUS
  Administered 2021-07-30 (×2): 50 ug via INTRAVENOUS
  Administered 2021-07-30 – 2021-07-31 (×3): 25 ug via INTRAVENOUS
  Administered 2021-07-31 (×2): 50 ug via INTRAVENOUS
  Administered 2021-08-01: 25 ug via INTRAVENOUS
  Administered 2021-08-01 – 2021-08-02 (×5): 50 ug via INTRAVENOUS
  Administered 2021-08-02 (×2): 25 ug via INTRAVENOUS
  Administered 2021-08-03 – 2021-08-07 (×23): 50 ug via INTRAVENOUS
  Filled 2021-07-29 (×44): qty 1

## 2021-07-29 MED ORDER — SODIUM CHLORIDE 0.9 % IV BOLUS
1000.0000 mL | Freq: Once | INTRAVENOUS | Status: AC
  Administered 2021-07-29: 1000 mL via INTRAVENOUS

## 2021-07-29 MED ORDER — MORPHINE SULFATE (PF) 4 MG/ML IV SOLN
4.0000 mg | Freq: Once | INTRAVENOUS | Status: AC
  Administered 2021-07-29: 4 mg via INTRAVENOUS
  Filled 2021-07-29: qty 1

## 2021-07-29 MED ORDER — ALBUTEROL SULFATE HFA 108 (90 BASE) MCG/ACT IN AERS
2.0000 | INHALATION_SPRAY | Freq: Four times a day (QID) | RESPIRATORY_TRACT | Status: DC | PRN
  Filled 2021-07-29: qty 6.7

## 2021-07-29 MED ORDER — MORPHINE SULFATE (PF) 2 MG/ML IV SOLN
1.0000 mg | INTRAVENOUS | Status: DC | PRN
  Administered 2021-07-29 (×2): 4 mg via INTRAVENOUS
  Administered 2021-07-29: 2 mg via INTRAVENOUS
  Administered 2021-07-29: 4 mg via INTRAVENOUS
  Filled 2021-07-29 (×4): qty 2

## 2021-07-29 NOTE — ED Triage Notes (Signed)
Patient reports that he was released from the hospital yesterday from having a take down of an incarcerated hernia. Patient also c/o N/V. Surgeon in to see patient in triage.  Patient states he passed gas this AM, but none this afternoon.

## 2021-07-29 NOTE — ED Provider Notes (Addendum)
[facility]-EMERGENCY DEPT Provider Note   CSN: [id number] Arrival date & time: 07/29/21  1633    History  Chief Complaint  Patient presents with   Chest Pain   Emesis    Raymond Watson is a 51 y.o. male with past medical history significant for recent gastropexy, discharged yesterday who presents for evaluation nausea, vomiting, epigastric and chest pain.  3 episodes of NBNB emesis.  States he was getting anticoagulation twice daily during admission, last dose yesterday.  Was able to eat Jell-O at home.  Rates his pain a 10/10.  Taking p.o. opiates at home without relief.  No fever.  No swelling, redness to his surgical wounds.  Passed flatus this morning.  No bowel movement today.  No urinary complaints.  No shortness of breath, lower extremity swelling.  HPI     Home Medications Prior to Admission medications   Medication Sig Start Date End Date Taking? Authorizing Provider  albuterol (VENTOLIN HFA) 108 (90 Base) MCG/ACT inhaler Inhale 2 puffs into the lungs every 6 (six) hours as needed for wheezing or shortness of breath. 05/10/20 07/29/21 Yes [provider]  amLODipine (NORVASC) 2.5 MG tablet Take 2.5 mg by mouth daily.   Yes [provider]  budesonide-formoterol (SYMBICORT) 80-4.5 MCG/ACT inhaler Take 2 puffs first thing in am and then another 2 puffs about 12 hours later. 02/08/21  Yes Nyoka Cowden, MD  cholecalciferol (VITAMIN D) 25 MCG (1000 UNIT) tablet Take 1,000 Units by mouth in the morning.   Yes [provider]  Multiple Vitamin (MULTIVITAMIN WITH MINERALS) TABS tablet Take 1 tablet by mouth in the morning. One-A-Day for Men 50+   Yes [provider]  ondansetron (ZOFRAN-ODT) 4 MG disintegrating tablet Take 1 tablet (4 mg total) by mouth every 8 (eight) hours as needed for nausea or refractory nausea / vomiting. 07/28/21  Yes Karie Soda, MD  oxyCODONE (ROXICODONE) 5 MG/5ML solution Take 5-10 mLs (5-10 mg total) by  mouth every 4 (four) hours as needed for moderate pain or severe pain (5 mg for moderate pain, 10 mg for severe pain). 07/28/21  Yes Chevis Pretty III, MD  oxyCODONE-acetaminophen (PERCOCET/ROXICET) 5-325 MG tablet Take 1 tablet by mouth every 4 (four) hours as needed for moderate pain. 07/28/21  Yes Luretha Murphy, MD  rosuvastatin (CRESTOR) 5 MG tablet Take 5 mg by mouth at bedtime. 05/11/20  Yes [provider]  Zinc 50 MG TABS Take 50 mg by mouth in the morning.   Yes [provider]      Allergies    Patient has no known allergies.    Review of Systems   Review of Systems  Constitutional:  Positive for activity change, appetite change and fatigue.  HENT: Negative.    Respiratory: Negative.    Cardiovascular:  Positive for chest pain. Negative for palpitations and leg swelling.  Gastrointestinal:  Positive for abdominal pain, nausea and vomiting. Negative for abdominal distention, constipation and diarrhea.  Genitourinary: Negative.   Musculoskeletal: Negative.   Skin: Negative.   Neurological: Negative.   All other systems reviewed and are negative.  Physical Exam Updated Vital Signs BP (!) 142/115    Pulse (!) 114    Temp 98.1 F (36.7 C) (Oral)    Resp 19    Ht 5\' 4"  (1.626 m)    Wt 63.7 kg    SpO2 100%    BMI 24.11 kg/m  Physical Exam Vitals and nursing note reviewed.  Constitutional:  General: He is in acute distress.     Appearance: He is well-developed. He is not ill-appearing, toxic-appearing or diaphoretic.  HENT:     Head: Atraumatic.  Eyes:     Pupils: Pupils are equal, round, and reactive to light.  Cardiovascular:     Rate and Rhythm: Normal rate and regular rhythm.     Pulses:          Radial pulses are 2+ on the right side and 2+ on the left side.       Dorsalis pedis pulses are 2+ on the right side and 2+ on the left side.     Heart sounds: Normal heart sounds.  Pulmonary:     Effort: Pulmonary effort is normal. No respiratory  distress.     Breath sounds: Normal breath sounds.     Comments: Clear Bl Chest:     Comments: Nontender Abdominal:     General: There is no distension.     Palpations: Abdomen is soft. There is no mass.     Tenderness: There is abdominal tenderness. There is no rebound.     Comments: Diffuse tenderness to abdomen.  Surgical sites without erythema, drainage, warmth  Musculoskeletal:        General: Normal range of motion.     Cervical back: Normal range of motion and neck supple.     Right lower leg: No tenderness. No edema.     Left lower leg: No tenderness. No edema.     Comments: No bony tenderness, full range of motion, compartment soft  Skin:    General: Skin is warm and dry.     Capillary Refill: Capillary refill takes less than 2 seconds.  Neurological:     General: No focal deficit present.     Mental Status: He is alert and oriented to person, place, and time.    ED Results / Procedures / Treatments   Labs (all labs ordered are listed, but only abnormal results are displayed) Labs Reviewed  CBC WITH DIFFERENTIAL/PLATELET - Abnormal; Notable for the following components:      Result Value   Neutro Abs 7.9 (*)    All other components within normal limits  LACTIC ACID, PLASMA - Abnormal; Notable for the following components:   Lactic Acid, Venous 2.6 (*)    All other components within normal limits  COMPREHENSIVE METABOLIC PANEL - Abnormal; Notable for the following components:   Glucose, Bld 116 (*)    BUN 26 (*)    Creatinine, Ser 1.27 (*)    Calcium 8.7 (*)    All other components within normal limits  I-STAT CHEM 8, ED - Abnormal; Notable for the following components:   Sodium 134 (*)    BUN 33 (*)    Creatinine, Ser 1.30 (*)    Glucose, Bld 111 (*)    Calcium, Ion 1.03 (*)    All other components within normal limits  CULTURE, BLOOD (ROUTINE X 2)  CULTURE, BLOOD (ROUTINE X 2)  LACTIC ACID, PLASMA  LIPASE, BLOOD  I-STAT BETA HCG BLOOD, ED ([facility], [facility], [facility]  ONLY)  TROPONIN I (HIGH SENSITIVITY)    EKG EKG Interpretation  Date/Time:  Sunday July 29 2021 16:54:00 EST Ventricular Rate:  116 PR Interval:  126 QRS Duration: 89 QT Interval:  345 QTC Calculation: 480 R Axis:   89 Text Interpretation: Sinus tachycardia LAE, consider biatrial enlargement Abnormal T, consider ischemia, lateral leads Confirmed by Pricilla LovelessGoldston, Scott [phone]([id number]) on 07/29/2021 8:06:11 PM  Radiology  CT Angio Chest PE W and/or Wo Contrast  Result Date: 07/29/2021 CLINICAL DATA:  Pulmonary embolism (PE) suspected, high prob; Epigastric pain CP, abd pain, recent Hernia surgery EXAM: CT ANGIOGRAPHY CHEST CT ABDOMEN AND PELVIS WITH CONTRAST TECHNIQUE: Multidetector CT imaging of the chest was performed using the standard protocol during bolus administration of intravenous contrast. Multiplanar CT image reconstructions and MIPs were obtained to evaluate the vascular anatomy. Multidetector CT imaging of the abdomen and pelvis was performed using the standard protocol during bolus administration of intravenous contrast. CONTRAST:  80mL OMNIPAQUE IOHEXOL 350 MG/ML SOLN COMPARISON:  CT angio chest 04/30/2020 FINDINGS: CTA CHEST FINDINGS Cardiovascular: Satisfactory opacification of the pulmonary arteries to the segmental level. No evidence of pulmonary embolism. Normal heart size. No significant pericardial effusion. The thoracic aorta is normal in caliber. No atherosclerotic plaque of the thoracic aorta. No coronary artery calcifications. Mediastinum/Nodes: No enlarged mediastinal, hilar, or axillary lymph nodes. Thyroid gland, trachea, and esophagus demonstrate no significant findings. Large hiatal hernia containing the entire gastric lumen which is dilated within air-fluid level. Poor enhancement of the stomach wall with associated surrounding free fluid. Lungs/Pleura: Left lower lobe atelectasis. Left upper lobe peribronchovascular consolidation (7:8). No pulmonary nodule. No pulmonary mass.  Trace left pleural effusion. No right pleural effusion. No pneumothorax. Musculoskeletal: No chest wall abnormality. No suspicious lytic or blastic osseous lesions. No acute displaced fracture. Review of the MIP images confirms the above findings. CT ABDOMEN and PELVIS FINDINGS Hepatobiliary: No focal liver abnormality. No gallstones, gallbladder wall thickening, or pericholecystic fluid. No biliary dilatation. Pancreas: No focal lesion. Normal pancreatic contour. No surrounding inflammatory changes. No main pancreatic ductal dilatation. Spleen: Normal in size without focal abnormality. Adrenals/Urinary Tract: No adrenal nodule bilaterally. Bilateral kidneys enhance symmetrically. No hydronephrosis. No hydroureter. The urinary bladder is unremarkable. On delayed imaging, there is no urothelial wall thickening and there are no filling defects in the opacified portions of the bilateral collecting systems or ureters. Stomach/Bowel: No evidence of bowel wall thickening or dilatation. Fluid dilatation of the appendiceal tip measuring 3.5 x 1.8 x 5 cm (2:51, 4:40). Vascular/Lymphatic: No abdominal aorta or iliac aneurysm. No abdominal, pelvic, or inguinal lymphadenopathy. Reproductive: Prostate is unremarkable. Other: Nonspecific 1.5 cm soft tissue density within the left lower abdominal mesentery (2:34). No intraperitoneal free fluid. No intraperitoneal free gas. No organized fluid collection. Musculoskeletal: No abdominal wall hernia or abnormality. No suspicious lytic or blastic osseous lesions. No acute displaced fracture. Review of the MIP images confirms the above findings. IMPRESSION: 1. 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 Slightly limited evaluation due to timing of contrast on CT PA. Recommend emergent surgical consultation. 2. Fluid dilatation of the appendiceal tip measuring 3.5 x 1.8 x 5 cm. 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 Recommend surgical consultation. 3.  Nonspecific 1.5 cm soft tissue density within the left lower abdominal mesentery. 4. No pulmonary embolus. 5. Left upper lobe peribronchovascular consolidation may represent infection/inflammation (such as aspiration pneumonia). 6. Trace left pleural effusion. These results were called by telephone at the time of interpretation on 07/29/2021 at 6:48 pm to provider [facility] , who verbally acknowledged these results. Electronically Signed   By: Tish Frederickson M.D.   On: 07/29/2021 19:02   CT ABDOMEN PELVIS W CONTRAST  Result Date: 07/29/2021 CLINICAL DATA:  Pulmonary embolism (PE) suspected, high prob; Epigastric pain CP, abd pain, recent Hernia surgery EXAM: CT ANGIOGRAPHY CHEST CT ABDOMEN AND PELVIS WITH CONTRAST TECHNIQUE: Multidetector CT imaging of the chest was performed  using the standard protocol during bolus administration of intravenous contrast. Multiplanar CT image reconstructions and MIPs were obtained to evaluate the vascular anatomy. Multidetector CT imaging of the abdomen and pelvis was performed using the standard protocol during bolus administration of intravenous contrast. CONTRAST:  30mL OMNIPAQUE IOHEXOL 350 MG/ML SOLN COMPARISON:  CT angio chest 04/30/2020 FINDINGS: CTA CHEST FINDINGS Cardiovascular: Satisfactory opacification of the pulmonary arteries to the segmental level. No evidence of pulmonary embolism. Normal heart size. No significant pericardial effusion. The thoracic aorta is normal in caliber. No atherosclerotic plaque of the thoracic aorta. No coronary artery calcifications. Mediastinum/Nodes: No enlarged mediastinal, hilar, or axillary lymph nodes. Thyroid gland, trachea, and esophagus demonstrate no significant findings. Large hiatal hernia containing the entire gastric lumen which is dilated within air-fluid level. Poor enhancement of the stomach wall with associated surrounding free fluid. Lungs/Pleura: Left lower lobe atelectasis. Left upper lobe peribronchovascular  consolidation (7:8). No pulmonary nodule. No pulmonary mass. Trace left pleural effusion. No right pleural effusion. No pneumothorax. Musculoskeletal: No chest wall abnormality. No suspicious lytic or blastic osseous lesions. No acute displaced fracture. Review of the MIP images confirms the above findings. CT ABDOMEN and PELVIS FINDINGS Hepatobiliary: No focal liver abnormality. No gallstones, gallbladder wall thickening, or pericholecystic fluid. No biliary dilatation. Pancreas: No focal lesion. Normal pancreatic contour. No surrounding inflammatory changes. No main pancreatic ductal dilatation. Spleen: Normal in size without focal abnormality. Adrenals/Urinary Tract: No adrenal nodule bilaterally. Bilateral kidneys enhance symmetrically. No hydronephrosis. No hydroureter. The urinary bladder is unremarkable. On delayed imaging, there is no urothelial wall thickening and there are no filling defects in the opacified portions of the bilateral collecting systems or ureters. Stomach/Bowel: No evidence of bowel wall thickening or dilatation. Fluid dilatation of the appendiceal tip measuring 3.5 x 1.8 x 5 cm (2:51, 4:40). Vascular/Lymphatic: No abdominal aorta or iliac aneurysm. No abdominal, pelvic, or inguinal lymphadenopathy. Reproductive: Prostate is unremarkable. Other: Nonspecific 1.5 cm soft tissue density within the left lower abdominal mesentery (2:34). No intraperitoneal free fluid. No intraperitoneal free gas. No organized fluid collection. Musculoskeletal: No abdominal wall hernia or abnormality. No suspicious lytic or blastic osseous lesions. No acute displaced fracture. Review of the MIP images confirms the above findings. IMPRESSION: 1. 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 Slightly limited evaluation due to timing of contrast on CT PA. Recommend emergent surgical consultation. 2. Fluid dilatation of the appendiceal tip measuring 3.5 x 1.8 x 5 cm. Finding concerning for  appendiceal mucocele. Recommend surgical consultation. 3. Nonspecific 1.5 cm soft tissue density within the left lower abdominal mesentery. 4. No pulmonary embolus. 5. Left upper lobe peribronchovascular consolidation may represent infection/inflammation (such as aspiration pneumonia). 6. Trace left pleural effusion. These results were called by telephone at the time of interpretation on 07/29/2021 at 6:48 pm to provider [facility] , who verbally acknowledged these results. Electronically Signed   By: Tish Frederickson M.D.   On: 07/29/2021 19:02   DG Abd Portable 2V  Result Date: 07/29/2021 CLINICAL DATA:  Chest pain on respiration.  Hernia repair. EXAM: PORTABLE ABDOMEN - 2 VIEW COMPARISON:  CT of the abdomen pelvis 07/29/2021 and upper GI barium study 07/25/2021. FINDINGS: Dilated stomach noted above the diaphragm. This remains concerning for gastric volvulus as described on CT. Contrast is present within the colon. Relative paucity of bowel gas noted otherwise. IMPRESSION: Dilated stomach above the diaphragm remains concerning for gastric volvulus with increased distension of the stomach compared to the upper GI study 4 days  ago. Electronically Signed   By: Marin Roberts M.D.   On: 07/29/2021 20:30    Procedures .Critical Care Performed by: Linwood Dibbles, PA-C Authorized by: Linwood Dibbles, PA-C   Critical care provider statement:    Critical care time (minutes):  31   Critical care was necessary to treat or prevent imminent or life-threatening deterioration of the following conditions: possible bowel ischemia.   Critical care was time spent personally by me on the following activities:  Development of treatment plan with patient or surrogate, discussions with consultants, evaluation of patient's response to treatment, examination of patient, ordering and review of laboratory studies, ordering and review of radiographic studies, ordering and performing treatments and interventions,  pulse oximetry, re-evaluation of patient's condition and review of old charts   Medications Ordered in ED Medications  dextrose 5 % and 0.9 % NaCl with KCl 20 mEq/L infusion (has no administration in time range)  morphine 2 MG/ML injection 1-4 mg (2 mg Intravenous Given 07/29/21 1955)  ondansetron (ZOFRAN-ODT) disintegrating tablet 4 mg ( Oral See Alternative 07/29/21 1955)    Or  ondansetron (ZOFRAN) injection 4 mg (4 mg Intravenous Given 07/29/21 1955)  pantoprazole (PROTONIX) injection 40 mg (has no administration in time range)  sodium chloride 0.9 % bolus 1,000 mL (has no administration in time range)  sodium chloride 0.9 % bolus 1,000 mL (0 mLs Intravenous Stopped 07/29/21 1955)  ondansetron (ZOFRAN) injection 4 mg (4 mg Intravenous Given 07/29/21 1709)  morphine 4 MG/ML injection 4 mg (4 mg Intravenous Given 07/29/21 1708)  ondansetron (ZOFRAN) injection 4 mg (4 mg Intravenous Given 07/29/21 1803)  sodium chloride 0.9 % bolus 500 mL (0 mLs Intravenous Stopped 07/29/21 1938)  iohexol (OMNIPAQUE) 350 MG/ML injection 80 mL (80 mLs Intravenous Contrast Given 07/29/21 1808)  HYDROmorphone (DILAUDID) injection 1 mg (1 mg Intravenous Given 07/29/21 1803)   ED Course/ Medical Decision Making/ A&P Clinical Course as of 07/29/21 2034  Wynelle Link Jul 29, 2021  1850 Called by Radiology Lequita Halt. Concern for volvulus with gastric ischemia. Will consult with Gen surgery, Dr. Carolynne Edouard [BH]  1859 CONSULT with Dr. Carolynne Edouard. Discussed Radiology concerns. Patient being admitted for further managment [BH]    Clinical Course User Index [BH] Colan Laymon A, PA-C                           Medical Decision Making 51 year old recently admitted and discharged yesterday after gastropexy with [facility]. postop day 4.  Here with emesis, abdominal pain, chest pain.  Was getting anticoagulation while inpatient.  No lower extremity swelling, shortness of breath.  No clinical evidence of VTE on exam.  Diffuse tenderness to abdomen.  He is mildly  tachycardic however does appear in significant pain.  No known fevers at home.  We will plan on labs, imaging, reassess.  Dr. Carolynne Edouard with general surgery into evaluate patient while in triage, plan for admission.  Discussed work-up with family in room.  Agreeable for admission.  Patient critically ill with postop complications.  Will be admitted by general surgery for further management and work-up.  Amount and/or Complexity of Data Reviewed Independent Historian: spouse    Details: Spouse provides history as well as Dr. Carolynne Edouard with surgery External Data Reviewed: labs, radiology and notes.    Details: Reviewed labs, imaging and inpatient reports with prior admissions.  07/23/21>>Surgery- Trinna Post, reduction of incarcerated stomach in the chest with partial anterior diaphragmatic closure and gastropexy Labs: ordered.  Details: CBC without leukocytosis Creatinine similar to prior No electrolyte abnormalities Radiology: ordered and independent interpretation performed. Decision-making details documented in ED Course.    Details: CTA chest without large PE however difficult to assess due to large possible gastric volvulus with possible ischemia> Discussed with on call surgeon Dr. Carolynne Edouardoth CT AP possible volvulus with ischemia> Discussed with on call Dr. Carolynne Edouardoth ECG/medicine tests: ordered and independent interpretation performed. Decision-making details documented in ED Course.    Details: EKG with sinus tachycardia Discussion of management or test interpretation with external provider(s): Dr. Carolynne Edouardoth general surgery  Risk Prescription drug management. Parenteral controlled substances. Decision regarding hospitalization. Emergency major surgery.    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

## 2021-07-29 NOTE — H&P (Signed)
Raymond Watson is an 51 y.o. male.   Chief Complaint: abd pain HPI: The patient is a 51 year old male who is just a few days out from a hiatal hernia repair by Dr. Hassell Done.  He was discharged from the hospital yesterday.  Today he developed some nausea and vomiting although it sounds like it was not a lot of volume.  He is complaining of some chest pain and abdominal pain.  He came back to the ER at our request and will be readmitted.  We will check some x-rays of his chest and belly and some electrolytes.  We will try to get his pain under control and then see how he does.  Past Medical History:  Diagnosis Date   GERD (gastroesophageal reflux disease)    Headache    History of kidney stones    Hyperlipidemia    Hypertension    no meds   Pneumonia 04/2020   due to Covid    Past Surgical History:  Procedure Laterality Date   HERNIA REPAIR     age [age]   UPPER GI ENDOSCOPY N/A 07/24/2021   Procedure: UPPER GI ENDOSCOPY;  Surgeon: Johnathan Hausen, MD;  Location: [facility];  Service: General;  Laterality: N/A;   XI ROBOTIC ASSISTED HIATAL HERNIA REPAIR N/A 12/04/2020   Procedure: LAPAROSCOPIC  HIATAL HERNIA REPAIR WITH FUNDOPLICATION;  Surgeon: Johnathan Hausen, MD;  Location: [facility];  Service: General;  Laterality: N/A;   XI ROBOTIC ASSISTED HIATAL HERNIA REPAIR N/A 07/24/2021   Procedure: XI ROBOTIC REDO ASSISTED TAKE DOWN INCARCERATED HERNIA WITH GASTROPEXY;  Surgeon: Johnathan Hausen, MD;  Location: [facility];  Service: General;  Laterality: N/A;    Family History  Problem Relation Age of Onset   Heart failure Mother    COPD Mother    Social History:  reports that he has never smoked. He has never used smokeless tobacco. He reports that he does not currently use drugs. He reports that he does not drink alcohol.  Allergies: No Known Allergies  (Not in a hospital admission)   No results found for this or any previous visit (from the past 48 hour(s)). No results found.  Review of Systems   Constitutional:  Positive for chills.  HENT: Negative.    Eyes: Negative.   Respiratory:  Positive for chest tightness.   Cardiovascular:  Positive for chest pain.  Gastrointestinal:  Positive for abdominal pain, nausea and vomiting.  Endocrine: Negative.   Genitourinary: Negative.   Musculoskeletal: Negative.   Skin: Negative.   Allergic/Immunologic: Negative.   Neurological: Negative.   Hematological: Negative.   Psychiatric/Behavioral: Negative.     Blood pressure (!) 139/99, pulse (!) 115, temperature 98.1 F (36.7 C), temperature source Oral, resp. rate 20, height 5\' 4"  (1.626 m), weight 63.7 kg, SpO2 97 %. Physical Exam Constitutional:      General: He is in acute distress.  HENT:     Head: Normocephalic and atraumatic.     Right Ear: External ear normal.     Left Ear: External ear normal.     Nose: Nose normal.     Mouth/Throat:     Mouth: Mucous membranes are moist.     Pharynx: Oropharynx is clear.  Eyes:     Extraocular Movements: Extraocular movements intact.     Conjunctiva/sclera: Conjunctivae normal.     Pupils: Pupils are equal, round, and reactive to light.  Cardiovascular:     Rate and Rhythm: Regular rhythm. Tachycardia present.     Pulses: Normal  pulses.     Heart sounds: Normal heart sounds.  Pulmonary:     Effort: Pulmonary effort is normal.     Breath sounds: Normal breath sounds.     Comments: There is increased work of breathing but lung sounds are clear Abdominal:     Tenderness: There is abdominal tenderness.     Comments: The abd is soft with moderate tenderness similar to how it was at d/c. Quiet. Not very distended  Musculoskeletal:        General: No swelling or tenderness. Normal range of motion.     Cervical back: Normal range of motion and neck supple. No tenderness.  Skin:    General: Skin is warm and dry.     Coloration: Skin is not jaundiced.  Neurological:     General: No focal deficit present.     Mental Status: He is alert  and oriented to person, place, and time.  Psychiatric:        Mood and Affect: Mood normal.        Behavior: Behavior normal.        Thought Content: Thought content normal.     Assessment/Plan The patient is just a few days out from a hiatal hernia repair by Dr. Hassell Done.  He was discharged from the hospital yesterday.  We did have some concerns about his discharge but he was intent on going home.  He initially did okay but this morning had some nausea and vomiting and has developed some chest pain and worsening abdominal pain.  We will readmit him to the hospital and evaluate him with lab work and x-rays.  We will work on getting his pain under control and hydrating him.  I will notify Dr. Hassell Done that he is back in the hospital.  Autumn Messing III, MD 07/29/2021, 4:50 PM

## 2021-07-29 NOTE — ED Notes (Signed)
Light green sent for repeat troponin, but per lab, first troponin was never run due to there being insufficient amount of blood in the tube. Lab made aware another light green and repeat lactic were sent approx. 10 minutes ago, which lab states they have not received yet.

## 2021-07-29 NOTE — ED Notes (Signed)
Per Diplomatic Services operational officer, lab's tube station was not working. Lactic and repeat light green walked to lab. Will place an order for another repeat troponin

## 2021-07-30 ENCOUNTER — Inpatient Hospital Stay (HOSPITAL_COMMUNITY): Payer: BC Managed Care – PPO

## 2021-07-30 ENCOUNTER — Inpatient Hospital Stay (HOSPITAL_COMMUNITY): Payer: BC Managed Care – PPO | Admitting: Anesthesiology

## 2021-07-30 ENCOUNTER — Encounter (HOSPITAL_COMMUNITY)
Admission: EM | Disposition: A | Discharge status: Other Acute Inpatient Hospital | Payer: Self-pay | Source: Home / Self Care | Attending: Surgery

## 2021-07-30 ENCOUNTER — Encounter (HOSPITAL_COMMUNITY): Payer: Self-pay | Admitting: Surgery

## 2021-07-30 DIAGNOSIS — R933 Abnormal findings on diagnostic imaging of other parts of digestive tract: Secondary | ICD-10-CM

## 2021-07-30 DIAGNOSIS — K449 Diaphragmatic hernia without obstruction or gangrene: Secondary | ICD-10-CM | POA: Diagnosis not present

## 2021-07-30 DIAGNOSIS — R112 Nausea with vomiting, unspecified: Secondary | ICD-10-CM

## 2021-07-30 DIAGNOSIS — R0789 Other chest pain: Secondary | ICD-10-CM | POA: Diagnosis not present

## 2021-07-30 DIAGNOSIS — K3189 Other diseases of stomach and duodenum: Secondary | ICD-10-CM | POA: Diagnosis not present

## 2021-07-30 DIAGNOSIS — R079 Chest pain, unspecified: Secondary | ICD-10-CM

## 2021-07-30 HISTORY — PX: ESOPHAGOGASTRODUODENOSCOPY (EGD) WITH PROPOFOL: SHX5813

## 2021-07-30 LAB — CBC WITH DIFFERENTIAL/PLATELET
Abs Immature Granulocytes: 0.04 10*3/uL (ref 0.00–0.07)
Basophils Absolute: 0 10*3/uL (ref 0.0–0.1)
Basophils Relative: 0 %
Eosinophils Absolute: 0 10*3/uL (ref 0.0–0.5)
Eosinophils Relative: 0 %
HCT: 36.9 % — ABNORMAL LOW (ref 39.0–52.0)
Hemoglobin: 12 g/dL — ABNORMAL LOW (ref 13.0–17.0)
Immature Granulocytes: 0 %
Lymphocytes Relative: 18 %
Lymphs Abs: 1.7 10*3/uL (ref 0.7–4.0)
MCH: 26.3 pg (ref 26.0–34.0)
MCHC: 32.5 g/dL (ref 30.0–36.0)
MCV: 80.7 fL (ref 80.0–100.0)
Monocytes Absolute: 1.1 10*3/uL — ABNORMAL HIGH (ref 0.1–1.0)
Monocytes Relative: 11 %
Neutro Abs: 6.7 10*3/uL (ref 1.7–7.7)
Neutrophils Relative %: 71 %
Platelets: 179 10*3/uL (ref 150–400)
RBC: 4.57 MIL/uL (ref 4.22–5.81)
RDW: 15.1 % (ref 11.5–15.5)
WBC: 9.6 10*3/uL (ref 4.0–10.5)
nRBC: 0 % (ref 0.0–0.2)

## 2021-07-30 LAB — RESP PANEL BY RT-PCR (FLU A&B, COVID) ARPGX2
Influenza A by PCR: NEGATIVE
Influenza B by PCR: NEGATIVE
SARS Coronavirus 2 by RT PCR: NEGATIVE

## 2021-07-30 LAB — COMPREHENSIVE METABOLIC PANEL
ALT: 19 U/L (ref 0–44)
AST: 17 U/L (ref 15–41)
Albumin: 3.3 g/dL — ABNORMAL LOW (ref 3.5–5.0)
Alkaline Phosphatase: 62 U/L (ref 38–126)
Anion gap: 6 (ref 5–15)
BUN: 22 mg/dL — ABNORMAL HIGH (ref 6–20)
CO2: 22 mmol/L (ref 22–32)
Calcium: 8.1 mg/dL — ABNORMAL LOW (ref 8.9–10.3)
Chloride: 109 mmol/L (ref 98–111)
Creatinine, Ser: 1.09 mg/dL (ref 0.61–1.24)
GFR, Estimated: >60 mL/min (ref >60)
Glucose, Bld: 125 mg/dL — ABNORMAL HIGH (ref 70–99)
Potassium: 4.4 mmol/L (ref 3.5–5.1)
Sodium: 137 mmol/L (ref 135–145)
Total Bilirubin: 0.9 mg/dL (ref 0.3–1.2)
Total Protein: 6.4 g/dL — ABNORMAL LOW (ref 6.5–8.1)

## 2021-07-30 LAB — HIV ANTIBODY (ROUTINE TESTING W REFLEX): HIV Screen 4th Generation wRfx: NONREACTIVE

## 2021-07-30 SURGERY — ESOPHAGOGASTRODUODENOSCOPY (EGD) WITH PROPOFOL
Anesthesia: Monitor Anesthesia Care

## 2021-07-30 MED ORDER — LACTATED RINGERS IV SOLN: INTRAVENOUS | Status: DC | PRN

## 2021-07-30 MED ORDER — PROPOFOL 10 MG/ML IV BOLUS
INTRAVENOUS | Status: DC | PRN
Start: 2021-07-30 — End: 2021-07-30
  Administered 2021-07-30: 30 mg via INTRAVENOUS

## 2021-07-30 MED ORDER — LIDOCAINE 2% (20 MG/ML) 5 ML SYRINGE
INTRAMUSCULAR | Status: DC | PRN
  Administered 2021-07-30: 60 mg via INTRAVENOUS

## 2021-07-30 MED ORDER — PROPOFOL 1000 MG/100ML IV EMUL
INTRAVENOUS | Status: AC
  Filled 2021-07-30: qty 100

## 2021-07-30 MED ORDER — PROPOFOL 500 MG/50ML IV EMUL
INTRAVENOUS | Status: DC | PRN
  Administered 2021-07-30: 150 ug/kg/min via INTRAVENOUS

## 2021-07-30 MED ORDER — PHENOL 1.4 % MT LIQD
1.0000 | OROMUCOSAL | Status: DC | PRN
  Filled 2021-07-30: qty 177

## 2021-07-30 SURGICAL SUPPLY — 15 items

## 2021-07-30 NOTE — Progress Notes (Signed)
Patient ID: Raymond Watson, male   DOB: 01-07-1971, 51 y.o.   MRN: [id number] [facility] Surgery Progress Note:   * No surgery found *  Subjective: Mental status is clear.  Complaints still with pressure and upper abdominal pain. Objective: Vital signs in last 24 hours: Temp:  [98 F (36.7 C)-98.4 F (36.9 C)] 98 F (36.7 C) (01/02 0829) Pulse Rate:  [101-119] 103 (01/02 0829) Resp:  [11-30] 17 (01/02 0829) BP: (138-148)/(99-119) 148/113 (01/02 0829) SpO2:  [95 %-100 %] 98 % (01/02 0829) Weight:  [63.6 kg-63.7 kg] 63.6 kg (01/01 2300)  Intake/Output from previous day: No intake/output data recorded. Intake/Output this shift: No intake/output data recorded.  Physical Exam: Work of breathing is not labored.  Mild distention of the abdomen with upper abdominal discomfort  Lab Results:  Results for orders placed or performed during the hospital encounter of 07/29/21 (from the past 48 hour(s))  CBC with Differential     Status: Abnormal   Collection Time: 07/29/21  4:56 PM  Result Value Ref Range   WBC 9.7 4.0 - 10.5 K/uL   RBC 5.72 4.22 - 5.81 MIL/uL   Hemoglobin 15.1 13.0 - 17.0 g/dL   HCT 40.9 81.1 - 91.4 %   MCV 81.5 80.0 - 100.0 fL   MCH 26.4 26.0 - 34.0 pg   MCHC 32.4 30.0 - 36.0 g/dL   RDW 78.2 95.6 - 21.3 %   Platelets 210 150 - 400 K/uL   nRBC 0.0 0.0 - 0.2 %   Neutrophils Relative % 82 %   Neutro Abs 7.9 (H) 1.7 - 7.7 K/uL   Lymphocytes Relative 12 %   Lymphs Abs 1.1 0.7 - 4.0 K/uL   Monocytes Relative 6 %   Monocytes Absolute 0.5 0.1 - 1.0 K/uL   Eosinophils Relative 0 %   Eosinophils Absolute 0.0 0.0 - 0.5 K/uL   Basophils Relative 0 %   Basophils Absolute 0.0 0.0 - 0.1 K/uL   Immature Granulocytes 0 %   Abs Immature Granulocytes 0.04 0.00 - 0.07 K/uL    Comment: Performed at [facility], [address] [place], [place] [address]  Lactic acid, plasma     Status: Abnormal   Collection Time: 07/29/21  4:56 PM  Result Value Ref Range    Lactic Acid, Venous 2.6 (HH) 0.5 - 1.9 mmol/L    Comment: CRITICAL RESULT CALLED TO, READ BACK BY AND VERIFIED WITH: CARLY, RN @ 1806 ON 07/29/2020 BY Sharlyne Cai, MLT Performed at [facility], [address], [place], [place] [address]   Blood culture (routine x 2)     Status: None (Preliminary result)   Collection Time: 07/29/21  5:00 PM   Specimen: BLOOD  Result Value Ref Range   Specimen Description      BLOOD RIGHT ANTECUBITAL Performed at [facility], [address] [place], [place] [address]    Special Requests      BOTTLES DRAWN AEROBIC AND ANAEROBIC Blood Culture results may not be optimal due to an inadequate volume of blood received in culture bottles Performed at [facility], [address] [place], [place] [address]    Culture      NO GROWTH < 24 HOURS Performed at [facility], [address], [place], [place] [address]    Report Status PENDING   I-Stat beta hCG blood, ED ([facility], [facility], [facility] only)     Status: None   Collection Time: 07/29/21  5:08  PM  Result Value Ref Range   I-stat hCG, quantitative <5.0 <5 mIU/mL   Comment 3            Comment:   GEST. AGE      CONC.  (mIU/mL)   <=1 WEEK        5 - 50     2 WEEKS       50 - 500     3 WEEKS       100 - 10,000     4 WEEKS     1,000 - 30,000        MALE AND NON-PREGNANT MALE:     LESS THAN 5 mIU/mL   I-stat chem 8, ED (not at [facility] or [facility])     Status: Abnormal   Collection Time: 07/29/21  5:47 PM  Result Value Ref Range   Sodium 134 (L) 135 - 145 mmol/L   Potassium 4.6 3.5 - 5.1 mmol/L   Chloride 103 98 - 111 mmol/L   BUN 33 (H) 6 - 20 mg/dL   Creatinine, Ser 0.981.30 (H) 0.61 - 1.24 mg/dL   Glucose, Bld 119111 (H) 70 - 99 mg/dL    Comment: Glucose reference range applies only to samples taken after fasting for at least 8 hours.   Calcium, Ion 1.03 (L) 1.15 - 1.40 mmol/L   TCO2 25 22 - 32 mmol/L   Hemoglobin 16.0 13.0 - 17.0 g/dL   HCT 14.747.0 82.939.0 - 56.252.0 %   Lactic acid, plasma     Status: None   Collection Time: 07/29/21  6:56 PM  Result Value Ref Range   Lactic Acid, Venous 1.9 0.5 - 1.9 mmol/L    Comment: Performed at [facility], [address] [place], [place] [address]  Comprehensive metabolic panel     Status: Abnormal   Collection Time: 07/29/21  7:15 PM  Result Value Ref Range   Sodium 136 135 - 145 mmol/L   Potassium 4.0 3.5 - 5.1 mmol/L   Chloride 104 98 - 111 mmol/L   CO2 25 22 - 32 mmol/L   Glucose, Bld 116 (H) 70 - 99 mg/dL    Comment: Glucose reference range applies only to samples taken after fasting for at least 8 hours.   BUN 26 (H) 6 - 20 mg/dL   Creatinine, Ser 5.781.27 (H) 0.61 - 1.24 mg/dL   Calcium 8.7 (L) 8.9 - 10.3 mg/dL   Total Protein 7.5 6.5 - 8.1 g/dL   Albumin 3.9 3.5 - 5.0 g/dL   AST 24 15 - 41 U/L   ALT 23 0 - 44 U/L   Alkaline Phosphatase 71 38 - 126 U/L   Total Bilirubin 1.1 0.3 - 1.2 mg/dL   GFR, Estimated >46>60 >96>60 mL/min    Comment: (NOTE) Calculated using the CKD-EPI Creatinine Equation (2021)    Anion gap 7 5 - 15    Comment: Performed at [facility], [address] [place], [place] [address]  Lipase, blood     Status: None   Collection Time: 07/29/21  7:15 PM  Result Value Ref Range   Lipase 26 11 - 51 U/L    Comment: Performed at [facility], [address] [place], [place] [address]  Troponin I (High Sensitivity)     Status: None   Collection Time: 07/29/21  7:15 PM  Result Value Ref Range   Troponin I (High Sensitivity) 11 <18 ng/L    Comment: (NOTE) Elevated  high sensitivity troponin I (hsTnI) values and significant  changes across serial measurements may suggest ACS but many other  chronic and acute conditions are known to elevate hsTnI results.  Refer to the "Links" section for chest pain algorithms and additional  guidance. Performed at [facility], [address] [place], [place] [address]    Comprehensive metabolic panel     Status: Abnormal   Collection Time: 07/30/21 12:48 AM  Result Value Ref Range   Sodium 137 135 - 145 mmol/L   Potassium 4.4 3.5 - 5.1 mmol/L   Chloride 109 98 - 111 mmol/L   CO2 22 22 - 32 mmol/L   Glucose, Bld 125 (H) 70 - 99 mg/dL    Comment: Glucose reference range applies only to samples taken after fasting for at least 8 hours.   BUN 22 (H) 6 - 20 mg/dL   Creatinine, Ser 6.78 0.61 - 1.24 mg/dL   Calcium 8.1 (L) 8.9 - 10.3 mg/dL   Total Protein 6.4 (L) 6.5 - 8.1 g/dL   Albumin 3.3 (L) 3.5 - 5.0 g/dL   AST 17 15 - 41 U/L   ALT 19 0 - 44 U/L   Alkaline Phosphatase 62 38 - 126 U/L   Total Bilirubin 0.9 0.3 - 1.2 mg/dL   GFR, Estimated >93 >81 mL/min    Comment: (NOTE) Calculated using the CKD-EPI Creatinine Equation (2021)    Anion gap 6 5 - 15    Comment: Performed at [facility], [address] [place], [place] [address]  CBC WITH DIFFERENTIAL     Status: Abnormal   Collection Time: 07/30/21 12:48 AM  Result Value Ref Range   WBC 9.6 4.0 - 10.5 K/uL   RBC 4.57 4.22 - 5.81 MIL/uL   Hemoglobin 12.0 (L) 13.0 - 17.0 g/dL   HCT 02.5 (L) 85.2 - 77.8 %   MCV 80.7 80.0 - 100.0 fL   MCH 26.3 26.0 - 34.0 pg   MCHC 32.5 30.0 - 36.0 g/dL   RDW 24.2 35.3 - 61.4 %   Platelets 179 150 - 400 K/uL   nRBC 0.0 0.0 - 0.2 %   Neutrophils Relative % 71 %   Neutro Abs 6.7 1.7 - 7.7 K/uL   Lymphocytes Relative 18 %   Lymphs Abs 1.7 0.7 - 4.0 K/uL   Monocytes Relative 11 %   Monocytes Absolute 1.1 (H) 0.1 - 1.0 K/uL   Eosinophils Relative 0 %   Eosinophils Absolute 0.0 0.0 - 0.5 K/uL   Basophils Relative 0 %   Basophils Absolute 0.0 0.0 - 0.1 K/uL   Immature Granulocytes 0 %   Abs Immature Granulocytes 0.04 0.00 - 0.07 K/uL    Comment: Performed at [facility], [address], [place], [place] [address]    Radiology/Results: CT Angio Chest PE W and/or Wo Contrast  Result Date: 07/29/2021 CLINICAL DATA:   Pulmonary embolism (PE) suspected, high prob; Epigastric pain CP, abd pain, recent Hernia surgery EXAM: CT ANGIOGRAPHY CHEST CT ABDOMEN AND PELVIS WITH CONTRAST TECHNIQUE: Multidetector CT imaging of the chest was performed using the standard protocol during bolus administration of intravenous contrast. Multiplanar CT image reconstructions and MIPs were obtained to evaluate the vascular anatomy. Multidetector CT imaging of the abdomen and pelvis was performed using the standard protocol during bolus administration of intravenous contrast. CONTRAST:  55mL OMNIPAQUE IOHEXOL 350 MG/ML SOLN COMPARISON:  CT angio chest 04/30/2020 FINDINGS: CTA CHEST FINDINGS Cardiovascular: Satisfactory opacification of the  pulmonary arteries to the segmental level. No evidence of pulmonary embolism. Normal heart size. No significant pericardial effusion. The thoracic aorta is normal in caliber. No atherosclerotic plaque of the thoracic aorta. No coronary artery calcifications. Mediastinum/Nodes: No enlarged mediastinal, hilar, or axillary lymph nodes. Thyroid gland, trachea, and esophagus demonstrate no significant findings. Large hiatal hernia containing the entire gastric lumen which is dilated within air-fluid level. Poor enhancement of the stomach wall with associated surrounding free fluid. Lungs/Pleura: Left lower lobe atelectasis. Left upper lobe peribronchovascular consolidation (7:8). No pulmonary nodule. No pulmonary mass. Trace left pleural effusion. No right pleural effusion. No pneumothorax. Musculoskeletal: No chest wall abnormality. No suspicious lytic or blastic osseous lesions. No acute displaced fracture. Review of the MIP images confirms the above findings. CT ABDOMEN and PELVIS FINDINGS Hepatobiliary: No focal liver abnormality. No gallstones, gallbladder wall thickening, or pericholecystic fluid. No biliary dilatation. Pancreas: No focal lesion. Normal pancreatic contour. No surrounding inflammatory changes. No main  pancreatic ductal dilatation. Spleen: Normal in size without focal abnormality. Adrenals/Urinary Tract: No adrenal nodule bilaterally. Bilateral kidneys enhance symmetrically. No hydronephrosis. No hydroureter. The urinary bladder is unremarkable. On delayed imaging, there is no urothelial wall thickening and there are no filling defects in the opacified portions of the bilateral collecting systems or ureters. Stomach/Bowel: No evidence of bowel wall thickening or dilatation. Fluid dilatation of the appendiceal tip measuring 3.5 x 1.8 x 5 cm (2:51, 4:40). Vascular/Lymphatic: No abdominal aorta or iliac aneurysm. No abdominal, pelvic, or inguinal lymphadenopathy. Reproductive: Prostate is unremarkable. Other: Nonspecific 1.5 cm soft tissue density within the left lower abdominal mesentery (2:34). No intraperitoneal free fluid. No intraperitoneal free gas. No organized fluid collection. Musculoskeletal: No abdominal wall hernia or abnormality. No suspicious lytic or blastic osseous lesions. No acute displaced fracture. Review of the MIP images confirms the above findings. IMPRESSION: 1. 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 Slightly limited evaluation due to timing of contrast on CT PA. Recommend emergent surgical consultation. 2. Fluid dilatation of the appendiceal tip measuring 3.5 x 1.8 x 5 cm. 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 Recommend surgical consultation. 3. Nonspecific 1.5 cm soft tissue density within the left lower abdominal mesentery. 4. No pulmonary embolus. 5. Left upper lobe peribronchovascular consolidation may represent infection/inflammation (such as aspiration pneumonia). 6. Trace left pleural effusion. These results were called by telephone at the time of interpretation on 07/29/2021 at 6:48 pm to provider [facility] , who verbally acknowledged these results. Electronically Signed   By: Tish Frederickson M.D.   On: 07/29/2021 19:02   CT ABDOMEN PELVIS W  CONTRAST  Result Date: 07/29/2021 CLINICAL DATA:  Pulmonary embolism (PE) suspected, high prob; Epigastric pain CP, abd pain, recent Hernia surgery EXAM: CT ANGIOGRAPHY CHEST CT ABDOMEN AND PELVIS WITH CONTRAST TECHNIQUE: Multidetector CT imaging of the chest was performed using the standard protocol during bolus administration of intravenous contrast. Multiplanar CT image reconstructions and MIPs were obtained to evaluate the vascular anatomy. Multidetector CT imaging of the abdomen and pelvis was performed using the standard protocol during bolus administration of intravenous contrast. CONTRAST:  80mL OMNIPAQUE IOHEXOL 350 MG/ML SOLN COMPARISON:  CT angio chest 04/30/2020 FINDINGS: CTA CHEST FINDINGS Cardiovascular: Satisfactory opacification of the pulmonary arteries to the segmental level. No evidence of pulmonary embolism. Normal heart size. No significant pericardial effusion. The thoracic aorta is normal in caliber. No atherosclerotic plaque of the thoracic aorta. No coronary artery calcifications. Mediastinum/Nodes: No enlarged mediastinal, hilar, or axillary lymph nodes. Thyroid gland, trachea,  and esophagus demonstrate no significant findings. Large hiatal hernia containing the entire gastric lumen which is dilated within air-fluid level. Poor enhancement of the stomach wall with associated surrounding free fluid. Lungs/Pleura: Left lower lobe atelectasis. Left upper lobe peribronchovascular consolidation (7:8). No pulmonary nodule. No pulmonary mass. Trace left pleural effusion. No right pleural effusion. No pneumothorax. Musculoskeletal: No chest wall abnormality. No suspicious lytic or blastic osseous lesions. No acute displaced fracture. Review of the MIP images confirms the above findings. CT ABDOMEN and PELVIS FINDINGS Hepatobiliary: No focal liver abnormality. No gallstones, gallbladder wall thickening, or pericholecystic fluid. No biliary dilatation. Pancreas: No focal lesion. Normal pancreatic  contour. No surrounding inflammatory changes. No main pancreatic ductal dilatation. Spleen: Normal in size without focal abnormality. Adrenals/Urinary Tract: No adrenal nodule bilaterally. Bilateral kidneys enhance symmetrically. No hydronephrosis. No hydroureter. The urinary bladder is unremarkable. On delayed imaging, there is no urothelial wall thickening and there are no filling defects in the opacified portions of the bilateral collecting systems or ureters. Stomach/Bowel: No evidence of bowel wall thickening or dilatation. Fluid dilatation of the appendiceal tip measuring 3.5 x 1.8 x 5 cm (2:51, 4:40). Vascular/Lymphatic: No abdominal aorta or iliac aneurysm. No abdominal, pelvic, or inguinal lymphadenopathy. Reproductive: Prostate is unremarkable. Other: Nonspecific 1.5 cm soft tissue density within the left lower abdominal mesentery (2:34). No intraperitoneal free fluid. No intraperitoneal free gas. No organized fluid collection. Musculoskeletal: No abdominal wall hernia or abnormality. No suspicious lytic or blastic osseous lesions. No acute displaced fracture. Review of the MIP images confirms the above findings. IMPRESSION: 1. 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 Slightly limited evaluation due to timing of contrast on CT PA. Recommend emergent surgical consultation. 2. Fluid dilatation of the appendiceal tip measuring 3.5 x 1.8 x 5 cm. 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 Recommend surgical consultation. 3. Nonspecific 1.5 cm soft tissue density within the left lower abdominal mesentery. 4. No pulmonary embolus. 5. Left upper lobe peribronchovascular consolidation may represent infection/inflammation (such as aspiration pneumonia). 6. Trace left pleural effusion. These results were called by telephone at the time of interpretation on 07/29/2021 at 6:48 pm to provider [facility] , who verbally acknowledged these results. Electronically Signed   By: Tish FredericksonMorgane  Naveau  M.D.   On: 07/29/2021 19:02   DG Chest Port 1 View  Result Date: 07/30/2021 CLINICAL DATA:  Chest pain EXAM: PORTABLE CHEST 1 VIEW COMPARISON:  CT 613 p.m. FINDINGS: Gas containing soft tissue mass overlies the cardiac silhouette and left lung base in keeping with a large hiatal hernia better seen on recent CT examination. Small left pleural effusion is present. Associated left basilar atelectasis. Right lung is clear. No pneumothorax. No pleural effusion on the right. Cardiac size is within normal limits. IMPRESSION: Large hiatal hernia, similar to that seen on prior CT examination. Small left pleural effusion. Electronically Signed   By: Helyn NumbersAshesh  Parikh M.D.   On: 07/30/2021 00:57   DG Abd Portable 2V  Result Date: 07/29/2021 CLINICAL DATA:  Chest pain on respiration.  Hernia repair. EXAM: PORTABLE ABDOMEN - 2 VIEW COMPARISON:  CT of the abdomen pelvis 07/29/2021 and upper GI barium study 07/25/2021. FINDINGS: Dilated stomach noted above the diaphragm. This remains concerning for gastric volvulus as described on CT. Contrast is present within the colon. Relative paucity of bowel gas noted otherwise. IMPRESSION: Dilated stomach above the diaphragm remains concerning for gastric volvulus with increased distension of the stomach compared to the upper GI study 4 days ago. Electronically Signed  By: Marin Roberts M.D.   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 Ardell Isaacs input.  He will decide about endscopy to decompress his  stomach.  .    I had discussed OR availability with Mr. Sedita today we have limited resources in the OR. CT report was curious in that the body of the dictation did not comment on the stomach and yet is was placed in the concluding findings.    Will defer to Dr. Russella Dar about potential endoscopic decompression  * No surgery found *    LOS: 1 day   Matt B. [name], MD, [facility] [phone] to reach the surgeon on call.    07/30/2021 9:45 AM

## 2021-07-30 NOTE — Progress Notes (Signed)
Pt states pain is mildly controlled with PRN dose of fentanyl IV.  Pt states that his pain was tolerable on arrival to unit but had returned when laid flat for chest x-ray.

## 2021-07-30 NOTE — Op Note (Addendum)
[facility] Patient Name: Raymond Watson Procedure Date: 07/30/2021 MRN: [id number] Attending MD: Meryl Dare , MD Date of Birth: 03/11/1971 CSN: [id number] Age: 51 Admit Type: Inpatient Procedure:                Upper GI endoscopy Indications:              Abnormal CT of the stomach, Chest pain (non                            cardiac), Nausea with vomiting Providers:                Venita Lick. Russella Dar, MD, Vicki Mallet, RN, [facility]                            Technician, Technician Referring MD:             [place]. [name] MD Medicines:                Monitored Anesthesia Care Complications:            No immediate complications. Estimated Blood Loss:     Estimated blood loss: none. Procedure:                Pre-Anesthesia Assessment:                           - Prior to the procedure, a History and Physical                            was performed, and patient medications and                            allergies were reviewed. The patient's tolerance of                            previous anesthesia was also reviewed. The risks                            and benefits of the procedure and the sedation                            options and risks were discussed with the patient.                            All questions were answered, and informed consent                            was obtained. Prior Anticoagulants: The patient has                            taken no previous anticoagulant or antiplatelet                            agents. ASA Grade Assessment: II - A patient with  mild systemic disease. After reviewing the risks                            and benefits, the patient was deemed in                            satisfactory condition to undergo the procedure.                           After obtaining informed consent, the endoscope was                            passed under direct vision. Throughout the                             procedure, the patient's blood pressure, pulse, and                            oxygen saturations were monitored continuously. The                            GIF-[id number] ([id number]) Olympus endoscope was introduced                            through the mouth, and advanced to the body of the                            stomach. The upper GI endoscopy was technically                            difficult and complex due to abnormal anatomy. The                            patient tolerated the procedure well. Scope In: Scope Out: Findings:      The examined esophagus was normal.      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 Due to the       altered anatomy the scope was in the fully retroflexed position to       visualize a portion of the gastric body. I could not advance the       endoscope beyond the gastric body. Unable to reduce the gastric       volvulus. The gastric lumen was fully decompressed and an NGT was placed       under endoscopic guidance to the proximal gastric body.      The exam of the stomach was otherwise normal. Impression:               - Normal esophagus.                           - Gastric volvulus.                           - Limited exam to the proximal gastric body.                           -  Gastric lumen was fully decompressed.                           - NGT placed. Moderate Sedation:      Not Applicable - Patient had care per Anesthesia. Recommendation:           - Return patient to hospital ward for ongoing care.                           - NPO.                           - NGT to low intermittent suction.                           - Further mgmt plans per Dr. [name] DeutscherMartin. Procedure Code(s):        --- Professional ---                           [phone], 52, Esophagogastroduodenoscopy, flexible,                            transoral; diagnostic, including collection of                            specimen(s) by brushing or washing, when performed                             (separate procedure) Diagnosis Code(s):        --- Professional ---                           K31.89, Other diseases of stomach and duodenum                           R07.89, Other chest pain                           R11.2, Nausea with vomiting, unspecified                           R93.3, Abnormal findings on diagnostic imaging of                            other parts of digestive tract CPT copyright 2019 [employer]. All rights reserved. The codes documented in this report are preliminary and upon coder review may  be revised to meet current compliance requirements. Meryl DareMalcolm T Nioma Mccubbins, MD 07/30/2021 10:55:51 AM This report has been signed electronically. Number of Addenda: 0

## 2021-07-30 NOTE — TOC CM/SW Note (Signed)
°  Transition of Care [facility]) Screening Note   Patient Details  Name: Shaka Cardin Date of Birth: [birth year]   Transition of Care [facility]) CM/SW Contact:    Darleene Cleaver, LCSW Phone Number: 07/30/2021, 6:29 PM    Transition of Care Department [facility]) has reviewed patient and no TOC needs have been identified at this time. We will continue to monitor patient advancement through interdisciplinary progression rounds. If new patient transition needs arise, please place a TOC consult.

## 2021-07-30 NOTE — H&P (View-Only) (Signed)
° °Consult Note ° ° °Referring Provider: Matthew Martin, MD °Primary Care Physician:  Shah, Ashish, MD °Primary Gastroenterologist:  none ° °Reason for Consultation:  abnormal gastric CT, chest pain, N/V ° °HPI: Raymond Watson is a 51 y.o. male with chest pain, nausea and vomiting 1 day after discharge from a robotic assisted take down of an incarcerated hiatal hernia with gastropexy performed on 07/24/2021. He underwent a lap hiatal hernia repair with fundoplication in May 2022. Pt complains of acute onset abdominal and chest pain yesterday which has now localized to lower chest pain with persistent nausea and vomiting.  Chest CT angio and CT AP were performed showing a large hiatal hernia containing the entire gastric lumen which is dilated with an air fluid level, gastric volvulus, dilation of the appendiceal tip concerning for a mucocele, nonspecific 1.5 cm left lower abdominal mesentery soft tissue density, LUL consolidation and no PE.    ° ° °Past Medical History:  °Diagnosis Date  ° GERD (gastroesophageal reflux disease)   ° Headache   ° History of kidney stones   ° Hyperlipidemia   ° Hypertension   ° no meds  ° Pneumonia 04/2020  ° due to Covid  ° ° °Past Surgical History:  °Procedure Laterality Date  ° HERNIA REPAIR    ° age 2,6  ° UPPER GI ENDOSCOPY N/A 07/24/2021  ° Procedure: UPPER GI ENDOSCOPY;  Surgeon: Martin, Matthew, MD;  Location: [facility];  Service: General;  Laterality: N/A;  ° XI ROBOTIC ASSISTED HIATAL HERNIA REPAIR N/A 12/04/2020  ° Procedure: LAPAROSCOPIC  HIATAL HERNIA REPAIR WITH FUNDOPLICATION;  Surgeon: Martin, Matthew, MD;  Location: [facility];  Service: General;  Laterality: N/A;  ° XI ROBOTIC ASSISTED HIATAL HERNIA REPAIR N/A 07/24/2021  ° Procedure: XI ROBOTIC REDO ASSISTED TAKE DOWN INCARCERATED HERNIA WITH GASTROPEXY;  Surgeon: Martin, Matthew, MD;  Location: [facility];  Service: General;  Laterality: N/A;  ° ° °Prior to Admission medications   °Medication Sig Start Date End Date Taking?  Authorizing Provider  °albuterol (VENTOLIN HFA) 108 (90 Base) MCG/ACT inhaler Inhale 2 puffs into the lungs every 6 (six) hours as needed for wheezing or shortness of breath. 05/10/20 07/29/21 Yes [provider]  °amLODipine (NORVASC) 2.5 MG tablet Take 2.5 mg by mouth daily.   Yes [provider]  °budesonide-formoterol (SYMBICORT) 80-4.5 MCG/ACT inhaler Take 2 puffs first thing in am and then another 2 puffs about 12 hours later. 02/08/21  Yes Wert, Michael B, MD  °cholecalciferol (VITAMIN D) 25 MCG (1000 UNIT) tablet Take 1,000 Units by mouth in the morning.   Yes [provider]  °Multiple Vitamin (MULTIVITAMIN WITH MINERALS) TABS tablet Take 1 tablet by mouth in the morning. One-A-Day for Men 50+   Yes [provider]  °ondansetron (ZOFRAN-ODT) 4 MG disintegrating tablet Take 1 tablet (4 mg total) by mouth every 8 (eight) hours as needed for nausea or refractory nausea / vomiting. 07/28/21  Yes Gross, Steven, MD  °oxyCODONE (ROXICODONE) 5 MG/5ML solution Take 5-10 mLs (5-10 mg total) by mouth every 4 (four) hours as needed for moderate pain or severe pain (5 mg for moderate pain, 10 mg for severe pain). 07/28/21  Yes Toth, Paul III, MD  °oxyCODONE-acetaminophen (PERCOCET/ROXICET) 5-325 MG tablet Take 1 tablet by mouth every 4 (four) hours as needed for moderate pain. 07/28/21  Yes Martin, Matthew, MD  °rosuvastatin (CRESTOR) 5 MG tablet Take 5 mg by mouth at bedtime. 05/11/20  Yes [provider]  °Zinc 50 MG   TABS Take 50 mg by mouth in the morning.   Yes [provider]  ° ° °Current Facility-Administered Medications  °Medication Dose Route Frequency Provider Last Rate Last Admin  ° albuterol (VENTOLIN HFA) 108 (90 Base) MCG/ACT inhaler 2 puff  2 puff Inhalation Q6H PRN Toth, Paul III, MD      ° dextrose 5 % and 0.9 % NaCl with KCl 20 mEq/L infusion   Intravenous Continuous Toth, Paul III, MD 100 mL/hr at 07/30/21 0734 New Bag at 07/30/21 0734  ° enoxaparin  (LOVENOX) injection 40 mg  40 mg Subcutaneous Q24H Toth, Paul III, MD   40 mg at 07/30/21 0032  ° fentaNYL (SUBLIMAZE) injection 25-50 mcg  25-50 mcg Intravenous Q1H PRN Toth, Paul III, MD   50 mcg at 07/30/21 0857  ° mometasone-formoterol (DULERA) 100-5 MCG/ACT inhaler 2 puff  2 puff Inhalation BID Toth, Paul III, MD      ° ondansetron (ZOFRAN-ODT) disintegrating tablet 4 mg  4 mg Oral Q6H PRN Toth, Paul III, MD      ° Or  ° ondansetron (ZOFRAN) injection 4 mg  4 mg Intravenous Q6H PRN Toth, Paul III, MD   4 mg at 07/29/21 1955  ° pantoprazole (PROTONIX) injection 40 mg  40 mg Intravenous QHS Toth, Paul III, MD   40 mg at 07/29/21 2200  ° ° °Allergies as of 07/29/2021  ° (No Known Allergies)  ° ° °Family History  °Problem Relation Age of Onset  ° Heart failure Mother   ° COPD Mother   ° ° °Social History  ° °Socioeconomic History  ° Marital status: Married  °  Spouse name: Not on file  ° Number of children: Not on file  ° Years of education: Not on file  ° Highest education level: Not on file  °Occupational History  ° Not on file  °Tobacco Use  ° Smoking status: Never  ° Smokeless tobacco: Never  °Vaping Use  ° Vaping Use: Never used  °Substance and Sexual Activity  ° Alcohol use: Never  ° Drug use: Not Currently  ° Sexual activity: Not on file  °Other Topics Concern  ° Not on file  °Social History Narrative  ° Not on file  ° °Social Determinants of Health  ° °Financial Resource Strain: Not on file  °Food Insecurity: Not on file  °Transportation Needs: Not on file  °Physical Activity: Not on file  °Stress: Not on file  °Social Connections: Not on file  °Intimate Partner Violence: Not on file  ° ° °Review of Systems: °Gen: Denies any fever, chills, sweats, anorexia, fatigue, weakness, malaise, weight loss, and sleep disorder °CV: Denies chest pain, angina, palpitations, syncope, orthopnea, PND, peripheral edema, and claudication. °Resp: Denies dyspnea at rest, dyspnea with exercise, cough, sputum, wheezing, coughing  up blood, and pleurisy. °GI: Denies vomiting blood, jaundice, and fecal incontinence.   Denies dysphagia or odynophagia. °GU : Denies urinary burning, blood in urine, urinary frequency, urinary hesitancy, nocturnal urination, and urinary incontinence. °MS: Denies joint pain, limitation of movement, and swelling, stiffness, low back pain, extremity pain. Denies muscle weakness, cramps, atrophy.  °Derm: Denies rash, itching, dry skin, hives, moles, warts, or unhealing ulcers.  °Psych: Denies depression, anxiety, memory loss, suicidal ideation, hallucinations, paranoia, and confusion. °Heme: Denies bruising, bleeding, and enlarged lymph nodes. °Neuro:  Denies any headaches, dizziness, paresthesias. °Endo:  Denies any problems with DM, thyroid, adrenal function. ° °Physical Exam: °Vital signs in last 24 hours: °Temp:  [98 °F (36.7 °C)-98.4 °  F (36.9 °C)] 98 °F (36.7 °C) (01/02 0829) °Pulse Rate:  [101-119] 103 (01/02 0829) °Resp:  [11-30] 17 (01/02 0829) °BP: (138-148)/(99-119) 148/113 (01/02 0829) °SpO2:  [95 %-100 %] 98 % (01/02 0829) °Weight:  [63.6 kg-63.7 kg] 63.6 kg (01/01 2300) °  ° °General:  Alert, well-developed, well-nourished, in NAD °Head:  Normocephalic and atraumatic. °Eyes:  Sclera clear, no icterus. Conjunctiva pink. °Ears:  Normal auditory acuity. °Nose:  No deformity, discharge, or lesions. °Mouth:  No deformity or lesions. Oropharynx pink & moist. °Neck:  Supple; no masses or thyromegaly. °Chest:  Clear throughout to auscultation. No wheezes, crackles, or rhonchi. No acute distress. °Heart:  Regular rate and rhythm; no murmurs, clicks, rubs, or gallops. °Abdomen:  Soft, nontender and nondistended. No masses, hepatosplenomegaly or hernias noted. Normal bowel sounds, without guarding, and without rebound.   °Rectal:  Not done.   °Msk:  Symmetrical without gross deformities. Normal posture. °Pulses:  Normal pulses noted. °Extremities:  Without clubbing or edema. °Neurologic:  Alert and  oriented x4;   grossly normal neurologically. °Skin:  Intact without significant lesions or rashes. °Cervical Nodes:  No significant cervical adenopathy. °Psych:  Alert and cooperative. Normal mood and affect. ° °Intake/Output from previous day: °No intake/output data recorded. °Intake/Output this shift: °No intake/output data recorded. ° °Lab Results: °Recent Labs  °  07/29/21 °1656 07/29/21 °1747 07/30/21 °0048  °WBC 9.7  --  9.6  °HGB 15.1 16.0 12.0*  °HCT 46.6 47.0 36.9*  °PLT 210  --  179  ° °BMET °Recent Labs  °  07/29/21 °1747 07/29/21 °1915 07/30/21 °0048  °NA 134* 136 137  °K 4.6 4.0 4.4  °CL 103 104 109  °CO2  --  25 22  °GLUCOSE 111* 116* 125*  °BUN 33* 26* 22*  °CREATININE 1.30* 1.27* 1.09  °CALCIUM  --  8.7* 8.1*  ° °LFT °Recent Labs  °  07/30/21 °0048  °PROT 6.4*  °ALBUMIN 3.3*  °AST 17  °ALT 19  °ALKPHOS 62  °BILITOT 0.9  ° ° ° °Studies/Results: °CT Angio Chest PE W and/or Wo Contrast ° °Result Date: 07/29/2021 °CLINICAL DATA:  Pulmonary embolism (PE) suspected, high prob; Epigastric pain CP, abd pain, recent Hernia surgery EXAM: CT ANGIOGRAPHY CHEST CT ABDOMEN AND PELVIS WITH CONTRAST TECHNIQUE: Multidetector CT imaging of the chest was performed using the standard protocol during bolus administration of intravenous contrast. Multiplanar CT image reconstructions and MIPs were obtained to evaluate the vascular anatomy. Multidetector CT imaging of the abdomen and pelvis was performed using the standard protocol during bolus administration of intravenous contrast. CONTRAST:  80mL OMNIPAQUE IOHEXOL 350 MG/ML SOLN COMPARISON:  CT angio chest 04/30/2020 FINDINGS: CTA CHEST FINDINGS Cardiovascular: Satisfactory opacification of the pulmonary arteries to the segmental level. No evidence of pulmonary embolism. Normal heart size. No significant pericardial effusion. The thoracic aorta is normal in caliber. No atherosclerotic plaque of the thoracic aorta. No coronary artery calcifications. Mediastinum/Nodes: No enlarged  mediastinal, hilar, or axillary lymph nodes. Thyroid gland, trachea, and esophagus demonstrate no significant findings. Large hiatal hernia containing the entire gastric lumen which is dilated within air-fluid level. Poor enhancement of the stomach wall with associated surrounding free fluid. Lungs/Pleura: Left lower lobe atelectasis. Left upper lobe peribronchovascular consolidation (7:8). No pulmonary nodule. No pulmonary mass. Trace left pleural effusion. No right pleural effusion. No pneumothorax. Musculoskeletal: No chest wall abnormality. No suspicious lytic or blastic osseous lesions. No acute displaced fracture. Review of the MIP images confirms the above findings. CT ABDOMEN and   PELVIS FINDINGS Hepatobiliary: No focal liver abnormality. No gallstones, gallbladder wall thickening, or pericholecystic fluid. No biliary dilatation. Pancreas: No focal lesion. Normal pancreatic contour. No surrounding inflammatory changes. No main pancreatic ductal dilatation. Spleen: Normal in size without focal abnormality. Adrenals/Urinary Tract: No adrenal nodule bilaterally. Bilateral kidneys enhance symmetrically. No hydronephrosis. No hydroureter. The urinary bladder is unremarkable. On delayed imaging, there is no urothelial wall thickening and there are no filling defects in the opacified portions of the bilateral collecting systems or ureters. Stomach/Bowel: No evidence of bowel wall thickening or dilatation. Fluid dilatation of the appendiceal tip measuring 3.5 x 1.8 x 5 cm (2:51, 4:40). Vascular/Lymphatic: No abdominal aorta or iliac aneurysm. No abdominal, pelvic, or inguinal lymphadenopathy. Reproductive: Prostate is unremarkable. Other: Nonspecific 1.5 cm soft tissue density within the left lower abdominal mesentery (2:34). No intraperitoneal free fluid. No intraperitoneal free gas. No organized fluid collection. Musculoskeletal: No abdominal wall hernia or abnormality. No suspicious lytic or blastic osseous  lesions. No acute displaced fracture. Review of the MIP images confirms the above findings. IMPRESSION: 1. 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 Slightly limited evaluation due to timing of contrast on CT PA. Recommend emergent surgical consultation. 2. Fluid dilatation of the appendiceal tip measuring 3.5 x 1.8 x 5 cm. 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 Recommend surgical consultation. 3. Nonspecific 1.5 cm soft tissue density within the left lower abdominal mesentery. 4. No pulmonary embolus. 5. Left upper lobe peribronchovascular consolidation may represent infection/inflammation (such as aspiration pneumonia). 6. Trace left pleural effusion. These results were called by telephone at the time of interpretation on 07/29/2021 at 6:48 pm to provider BRITNI HENDERLY , who verbally acknowledged these results. Electronically Signed   By: Morgane  Naveau M.D.   On: 07/29/2021 19:02  ° °CT ABDOMEN PELVIS W CONTRAST ° °Result Date: 07/29/2021 °CLINICAL DATA:  Pulmonary embolism (PE) suspected, high prob; Epigastric pain CP, abd pain, recent Hernia surgery EXAM: CT ANGIOGRAPHY CHEST CT ABDOMEN AND PELVIS WITH CONTRAST TECHNIQUE: Multidetector CT imaging of the chest was performed using the standard protocol during bolus administration of intravenous contrast. Multiplanar CT image reconstructions and MIPs were obtained to evaluate the vascular anatomy. Multidetector CT imaging of the abdomen and pelvis was performed using the standard protocol during bolus administration of intravenous contrast. CONTRAST:  80mL OMNIPAQUE IOHEXOL 350 MG/ML SOLN COMPARISON:  CT angio chest 04/30/2020 FINDINGS: CTA CHEST FINDINGS Cardiovascular: Satisfactory opacification of the pulmonary arteries to the segmental level. No evidence of pulmonary embolism. Normal heart size. No significant pericardial effusion. The thoracic aorta is normal in caliber. No atherosclerotic plaque of the thoracic aorta. No  coronary artery calcifications. Mediastinum/Nodes: No enlarged mediastinal, hilar, or axillary lymph nodes. Thyroid gland, trachea, and esophagus demonstrate no significant findings. Large hiatal hernia containing the entire gastric lumen which is dilated within air-fluid level. Poor enhancement of the stomach wall with associated surrounding free fluid. Lungs/Pleura: Left lower lobe atelectasis. Left upper lobe peribronchovascular consolidation (7:8). No pulmonary nodule. No pulmonary mass. Trace left pleural effusion. No right pleural effusion. No pneumothorax. Musculoskeletal: No chest wall abnormality. No suspicious lytic or blastic osseous lesions. No acute displaced fracture. Review of the MIP images confirms the above findings. CT ABDOMEN and PELVIS FINDINGS Hepatobiliary: No focal liver abnormality. No gallstones, gallbladder wall thickening, or pericholecystic fluid. No biliary dilatation. Pancreas: No focal lesion. Normal pancreatic contour. No surrounding inflammatory changes. No main pancreatic ductal dilatation. Spleen: Normal in size without focal abnormality. Adrenals/Urinary Tract: No adrenal nodule bilaterally. Bilateral   kidneys enhance symmetrically. No hydronephrosis. No hydroureter. The urinary bladder is unremarkable. On delayed imaging, there is no urothelial wall thickening and there are no filling defects in the opacified portions of the bilateral collecting systems or ureters. Stomach/Bowel: No evidence of bowel wall thickening or dilatation. Fluid dilatation of the appendiceal tip measuring 3.5 x 1.8 x 5 cm (2:51, 4:40). Vascular/Lymphatic: No abdominal aorta or iliac aneurysm. No abdominal, pelvic, or inguinal lymphadenopathy. Reproductive: Prostate is unremarkable. Other: Nonspecific 1.5 cm soft tissue density within the left lower abdominal mesentery (2:34). No intraperitoneal free fluid. No intraperitoneal free gas. No organized fluid collection. Musculoskeletal: No abdominal wall  hernia or abnormality. No suspicious lytic or blastic osseous lesions. No acute displaced fracture. Review of the MIP images confirms the above findings. IMPRESSION: 1. 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 Slightly limited evaluation due to timing of contrast on CT PA. Recommend emergent surgical consultation. 2. Fluid dilatation of the appendiceal tip measuring 3.5 x 1.8 x 5 cm. 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 Recommend surgical consultation. 3. Nonspecific 1.5 cm soft tissue density within the left lower abdominal mesentery. 4. No pulmonary embolus. 5. Left upper lobe peribronchovascular consolidation may represent infection/inflammation (such as aspiration pneumonia). 6. Trace left pleural effusion. These results were called by telephone at the time of interpretation on 07/29/2021 at 6:48 pm to provider BRITNI HENDERLY , who verbally acknowledged these results. Electronically Signed   By: Morgane  Naveau M.D.   On: 07/29/2021 19:02  ° °DG Chest Port 1 View ° °Result Date: 07/30/2021 °CLINICAL DATA:  Chest pain EXAM: PORTABLE CHEST 1 VIEW COMPARISON:  CT 613 p.m. FINDINGS: Gas containing soft tissue mass overlies the cardiac silhouette and left lung base in keeping with a large hiatal hernia better seen on recent CT examination. Small left pleural effusion is present. Associated left basilar atelectasis. Right lung is clear. No pneumothorax. No pleural effusion on the right. Cardiac size is within normal limits. IMPRESSION: Large hiatal hernia, similar to that seen on prior CT examination. Small left pleural effusion. Electronically Signed   By: Ashesh  Parikh M.D.   On: 07/30/2021 00:57  ° °DG Abd Portable 2V ° °Result Date: 07/29/2021 °CLINICAL DATA:  Chest pain on respiration.  Hernia repair. EXAM: PORTABLE ABDOMEN - 2 VIEW COMPARISON:  CT of the abdomen pelvis 07/29/2021 and upper GI barium study 07/25/2021. FINDINGS: Dilated stomach noted above the diaphragm. This remains  concerning for gastric volvulus as described on CT. Contrast is present within the colon. Relative paucity of bowel gas noted otherwise. IMPRESSION: Dilated stomach above the diaphragm remains concerning for gastric volvulus with increased distension of the stomach compared to the upper GI study 4 days ago. Electronically Signed   By: Christopher  Mattern M.D.   On: 07/29/2021 20:30   ° ° °Impression/ Recommendations: °Acute abdominal pain and chest pain yesterday, now localized to lower chest pain, with persistent nausea and vomiting 6 days following redo take down of an incarcerated hiatal hernia with gastropexy. CT yesterday shows a large hiatal hernia and findings consistent with a gastric volvulus. Surgeries, CT findings and management discussed in detail with Dr. Martin, the patient and his wife. Urgent EGD is indicated for diagnostic and potentially therapeutic purposes. Although EGD can often temporarily reduce a gastric volvulus given intrathoracic gastric location and prior surgery EGD likely will not be able to reduce in this situation. Surgical mgmt may be required.   ° ° ° LOS: 1 day  ° °Michille Mcelrath T. Jamy Cleckler   MD 07/30/2021, 9:27 AM °See [facility], [facility], to contact our on call provider °  °

## 2021-07-30 NOTE — Anesthesia Preprocedure Evaluation (Addendum)
Anesthesia Evaluation  Patient identified by MRN, date of birth, ID band Patient awake    Reviewed: Allergy & Precautions, H&P , NPO status , Patient's Chart, lab work & pertinent test results  Airway Mallampati: I  TM Distance: >3 FB Neck ROM: Full    Dental no notable dental hx. (+) Teeth Intact, Dental Advisory Given   Pulmonary neg pulmonary ROS,    Pulmonary exam normal breath sounds clear to auscultation       Cardiovascular hypertension, Pt. on medications + DOE   Rhythm:Regular Rate:Normal     Neuro/Psych  Headaches, negative psych ROS   GI/Hepatic Neg liver ROS, hiatal hernia, GERD  Medicated,  Endo/Other  negative endocrine ROS  Renal/GU negative Renal ROS  negative genitourinary   Musculoskeletal   Abdominal   Peds  Hematology negative hematology ROS (+)   Anesthesia Other Findings   Reproductive/Obstetrics negative OB ROS                            Anesthesia Physical Anesthesia Plan  ASA: 2 and emergent  Anesthesia Plan: MAC   Post-op Pain Management: Minimal or no pain anticipated   Induction: Intravenous  PONV Risk Score and Plan: 1 and Propofol infusion  Airway Management Planned: Natural Airway and Simple Face Mask  Additional Equipment:   Intra-op Plan:   Post-operative Plan:   Informed Consent: I have reviewed the patients History and Physical, chart, labs and discussed the procedure including the risks, benefits and alternatives for the proposed anesthesia with the patient or authorized representative who has indicated his/her understanding and acceptance.     Dental advisory given  Plan Discussed with: CRNA  Anesthesia Plan Comments:         Anesthesia Quick Evaluation

## 2021-07-30 NOTE — Anesthesia Procedure Notes (Signed)
Procedure Name: MAC Date/Time: 07/30/2021 10:32 AM Performed by: Claudia Desanctis, CRNA Pre-anesthesia Checklist: Patient identified, Emergency Drugs available, Suction available and Patient being monitored Patient Re-evaluated:Patient Re-evaluated prior to induction Oxygen Delivery Method: Simple face mask

## 2021-07-30 NOTE — Interval H&P Note (Signed)
History and Physical Interval Note:  07/30/2021 10:32 AM  Raymond Watson  has presented today for surgery, with the diagnosis of gastric volvulus.  The various methods of treatment have been discussed with the patient and family. After consideration of risks, benefits and other options for treatment, the patient has consented to  Procedure(s): ESOPHAGOGASTRODUODENOSCOPY (EGD) WITH PROPOFOL (N/A) as a surgical intervention.  The patient's history has been reviewed, patient examined, no change in status, stable for surgery.  I have reviewed the patient's chart and labs.  Questions were answered to the patient's satisfaction.     Venita Lick. Russella Dar

## 2021-07-30 NOTE — Anesthesia Postprocedure Evaluation (Signed)
Anesthesia Post Note  Patient: Zayon Trulson  Procedure(s) Performed: ESOPHAGOGASTRODUODENOSCOPY (EGD) WITH PROPOFOL     Patient location during evaluation: PACU Anesthesia Type: MAC Level of consciousness: awake and alert Pain management: pain level controlled Vital Signs Assessment: post-procedure vital signs reviewed and stable Respiratory status: spontaneous breathing, nonlabored ventilation and respiratory function stable Cardiovascular status: stable and blood pressure returned to baseline Postop Assessment: no apparent nausea or vomiting Anesthetic complications: no   No notable events documented.  Last Vitals:  Vitals:   07/30/21 1100 07/30/21 1110  BP: (!) 128/93 (!) 128/99  Pulse: 83 83  Resp: 17 18  Temp:    SpO2: 97% 94%    Last Pain:  Vitals:   07/30/21 1054  TempSrc: Oral  PainSc: 0-No pain                 Alp Goldwater,W. EDMOND

## 2021-07-30 NOTE — Consult Note (Signed)
Consult Note   Referring Provider: Luretha Murphy, MD Primary Care Physician:  Kirstie Peri, MD Primary Gastroenterologist:  none  Reason for Consultation:  abnormal gastric CT, chest pain, N/V  HPI: Raymond Watson is a 51 y.o. male with chest pain, nausea and vomiting 1 day after discharge from a robotic assisted take down of an incarcerated hiatal hernia with gastropexy performed on 07/24/2021. He underwent a lap hiatal hernia repair with fundoplication in May 2022. Pt complains of acute onset abdominal and chest pain yesterday which has now localized to lower chest pain with persistent nausea and vomiting.  Chest CT angio and CT AP were performed showing a large hiatal hernia containing the entire gastric lumen which is dilated with an air fluid level, gastric volvulus, dilation of the appendiceal tip concerning for a mucocele, nonspecific 1.5 cm left lower abdominal mesentery soft tissue density, LUL consolidation and no PE.      Past Medical History:  Diagnosis Date   GERD (gastroesophageal reflux disease)    Headache    History of kidney stones    Hyperlipidemia    Hypertension    no meds   Pneumonia 04/2020   due to Covid    Past Surgical History:  Procedure Laterality Date   HERNIA REPAIR     age [age]   UPPER GI ENDOSCOPY N/A 07/24/2021   Procedure: UPPER GI ENDOSCOPY;  Surgeon: Luretha Murphy, MD;  Location: [facility];  Service: General;  Laterality: N/A;   XI ROBOTIC ASSISTED HIATAL HERNIA REPAIR N/A 12/04/2020   Procedure: LAPAROSCOPIC  HIATAL HERNIA REPAIR WITH FUNDOPLICATION;  Surgeon: Luretha Murphy, MD;  Location: [facility];  Service: General;  Laterality: N/A;   XI ROBOTIC ASSISTED HIATAL HERNIA REPAIR N/A 07/24/2021   Procedure: XI ROBOTIC REDO ASSISTED TAKE DOWN INCARCERATED HERNIA WITH GASTROPEXY;  Surgeon: Luretha Murphy, MD;  Location: [facility];  Service: General;  Laterality: N/A;    Prior to Admission medications   Medication Sig Start Date End Date Taking?  Authorizing Provider  albuterol (VENTOLIN HFA) 108 (90 Base) MCG/ACT inhaler Inhale 2 puffs into the lungs every 6 (six) hours as needed for wheezing or shortness of breath. 05/10/20 07/29/21 Yes [provider]  amLODipine (NORVASC) 2.5 MG tablet Take 2.5 mg by mouth daily.   Yes [provider]  budesonide-formoterol (SYMBICORT) 80-4.5 MCG/ACT inhaler Take 2 puffs first thing in am and then another 2 puffs about 12 hours later. 02/08/21  Yes Nyoka Cowden, MD  cholecalciferol (VITAMIN D) 25 MCG (1000 UNIT) tablet Take 1,000 Units by mouth in the morning.   Yes [provider]  Multiple Vitamin (MULTIVITAMIN WITH MINERALS) TABS tablet Take 1 tablet by mouth in the morning. One-A-Day for Men 50+   Yes [provider]  ondansetron (ZOFRAN-ODT) 4 MG disintegrating tablet Take 1 tablet (4 mg total) by mouth every 8 (eight) hours as needed for nausea or refractory nausea / vomiting. 07/28/21  Yes Karie Soda, MD  oxyCODONE (ROXICODONE) 5 MG/5ML solution Take 5-10 mLs (5-10 mg total) by mouth every 4 (four) hours as needed for moderate pain or severe pain (5 mg for moderate pain, 10 mg for severe pain). 07/28/21  Yes Chevis Pretty III, MD  oxyCODONE-acetaminophen (PERCOCET/ROXICET) 5-325 MG tablet Take 1 tablet by mouth every 4 (four) hours as needed for moderate pain. 07/28/21  Yes Luretha Murphy, MD  rosuvastatin (CRESTOR) 5 MG tablet Take 5 mg by mouth at bedtime. 05/11/20  Yes [provider]  Zinc 50 MG  TABS Take 50 mg by mouth in the morning.   Yes [provider]    Current Facility-Administered Medications  Medication Dose Route Frequency Provider Last Rate Last Admin   albuterol (VENTOLIN HFA) 108 (90 Base) MCG/ACT inhaler 2 puff  2 puff Inhalation Q6H PRN Chevis Prettyoth, Paul III, MD       dextrose 5 % and 0.9 % NaCl with KCl 20 mEq/L infusion   Intravenous Continuous Chevis Prettyoth, Paul III, MD 100 mL/hr at 07/30/21 0734 New Bag at 07/30/21 0734   enoxaparin  (LOVENOX) injection 40 mg  40 mg Subcutaneous Q24H Chevis Prettyoth, Paul III, MD   40 mg at 07/30/21 0032   fentaNYL (SUBLIMAZE) injection 25-50 mcg  25-50 mcg Intravenous Q1H PRN Chevis Prettyoth, Paul III, MD   50 mcg at 07/30/21 0857   mometasone-formoterol (DULERA) 100-5 MCG/ACT inhaler 2 puff  2 puff Inhalation BID Chevis Prettyoth, Paul III, MD       ondansetron (ZOFRAN-ODT) disintegrating tablet 4 mg  4 mg Oral Q6H PRN Chevis Prettyoth, Paul III, MD       Or   ondansetron [facility](ZOFRAN) injection 4 mg  4 mg Intravenous Q6H PRN Chevis Prettyoth, Paul III, MD   4 mg at 07/29/21 1955   pantoprazole (PROTONIX) injection 40 mg  40 mg Intravenous QHS Chevis Prettyoth, Paul III, MD   40 mg at 07/29/21 2200    Allergies as of 07/29/2021   (No Known Allergies)    Family History  Problem Relation Age of Onset   Heart failure Mother    COPD Mother     Social History   Socioeconomic History   Marital status: Married    Spouse name: Not on file   Number of children: Not on file   Years of education: Not on file   Highest education level: Not on file  Occupational History   Not on file  Tobacco Use   Smoking status: Never   Smokeless tobacco: Never  Vaping Use   Vaping Use: Never used  Substance and Sexual Activity   Alcohol use: Never   Drug use: Not Currently   Sexual activity: Not on file  Other Topics Concern   Not on file  Social History Narrative   Not on file   Social Determinants of Health   Financial Resource Strain: Not on file  Food Insecurity: Not on file  Transportation Needs: Not on file  Physical Activity: Not on file  Stress: Not on file  Social Connections: Not on file  Intimate Partner Violence: Not on file    Review of Systems: Gen: Denies any fever, chills, sweats, anorexia, fatigue, weakness, malaise, weight loss, and sleep disorder CV: Denies chest pain, angina, palpitations, syncope, orthopnea, PND, peripheral edema, and claudication. Resp: Denies dyspnea at rest, dyspnea with exercise, cough, sputum, wheezing, coughing  up blood, and pleurisy. GI: Denies vomiting blood, jaundice, and fecal incontinence.   Denies dysphagia or odynophagia. GU : Denies urinary burning, blood in urine, urinary frequency, urinary hesitancy, nocturnal urination, and urinary incontinence. MS: Denies joint pain, limitation of movement, and swelling, stiffness, low back pain, extremity pain. Denies muscle weakness, cramps, atrophy.  Derm: Denies rash, itching, dry skin, hives, moles, warts, or unhealing ulcers.  Psych: Denies depression, anxiety, memory loss, suicidal ideation, hallucinations, paranoia, and confusion. Heme: Denies bruising, bleeding, and enlarged lymph nodes. Neuro:  Denies any headaches, dizziness, paresthesias. Endo:  Denies any problems with DM, thyroid, adrenal function.  Physical Exam: Vital signs in last 24 hours: Temp:  [98 F (36.7 C)-98.4  F (36.9 C)] 98 F (36.7 C) (01/02 0829) Pulse Rate:  [101-119] 103 (01/02 0829) Resp:  [11-30] 17 (01/02 0829) BP: (138-148)/(99-119) 148/113 (01/02 0829) SpO2:  [95 %-100 %] 98 % (01/02 0829) Weight:  [63.6 kg-63.7 kg] 63.6 kg (01/01 2300)    General:  Alert, well-developed, well-nourished, in NAD Head:  Normocephalic and atraumatic. Eyes:  Sclera clear, no icterus. Conjunctiva pink. Ears:  Normal auditory acuity. Nose:  No deformity, discharge, or lesions. Mouth:  No deformity or lesions. Oropharynx pink & moist. Neck:  Supple; no masses or thyromegaly. Chest:  Clear throughout to auscultation. No wheezes, crackles, or rhonchi. No acute distress. Heart:  Regular rate and rhythm; no murmurs, clicks, rubs, or gallops. Abdomen:  Soft, nontender and nondistended. No masses, hepatosplenomegaly or hernias noted. Normal bowel sounds, without guarding, and without rebound.   Rectal:  Not done.   Msk:  Symmetrical without gross deformities. Normal posture. Pulses:  Normal pulses noted. Extremities:  Without clubbing or edema. Neurologic:  Alert and  oriented x4;   grossly normal neurologically. Skin:  Intact without significant lesions or rashes. Cervical Nodes:  No significant cervical adenopathy. Psych:  Alert and cooperative. Normal mood and affect.  Intake/Output from previous day: No intake/output data recorded. Intake/Output this shift: No intake/output data recorded.  Lab Results: Recent Labs    07/29/21 1656 07/29/21 1747 07/30/21 0048  WBC 9.7  --  9.6  HGB 15.1 16.0 12.0*  HCT 46.6 47.0 36.9*  PLT 210  --  179   BMET Recent Labs    07/29/21 1747 07/29/21 1915 07/30/21 0048  NA 134* 136 137  K 4.6 4.0 4.4  CL 103 104 109  CO2  --  25 22  GLUCOSE 111* 116* 125*  BUN 33* 26* 22*  CREATININE 1.30* 1.27* 1.09  CALCIUM  --  8.7* 8.1*   LFT Recent Labs    07/30/21 0048  PROT 6.4*  ALBUMIN 3.3*  AST 17  ALT 19  ALKPHOS 62  BILITOT 0.9     Studies/Results: CT Angio Chest PE W and/or Wo Contrast  Result Date: 07/29/2021 CLINICAL DATA:  Pulmonary embolism (PE) suspected, high prob; Epigastric pain CP, abd pain, recent Hernia surgery EXAM: CT ANGIOGRAPHY CHEST CT ABDOMEN AND PELVIS WITH CONTRAST TECHNIQUE: Multidetector CT imaging of the chest was performed using the standard protocol during bolus administration of intravenous contrast. Multiplanar CT image reconstructions and MIPs were obtained to evaluate the vascular anatomy. Multidetector CT imaging of the abdomen and pelvis was performed using the standard protocol during bolus administration of intravenous contrast. CONTRAST:  42mL OMNIPAQUE IOHEXOL 350 MG/ML SOLN COMPARISON:  CT angio chest 04/30/2020 FINDINGS: CTA CHEST FINDINGS Cardiovascular: Satisfactory opacification of the pulmonary arteries to the segmental level. No evidence of pulmonary embolism. Normal heart size. No significant pericardial effusion. The thoracic aorta is normal in caliber. No atherosclerotic plaque of the thoracic aorta. No coronary artery calcifications. Mediastinum/Nodes: No enlarged  mediastinal, hilar, or axillary lymph nodes. Thyroid gland, trachea, and esophagus demonstrate no significant findings. Large hiatal hernia containing the entire gastric lumen which is dilated within air-fluid level. Poor enhancement of the stomach wall with associated surrounding free fluid. Lungs/Pleura: Left lower lobe atelectasis. Left upper lobe peribronchovascular consolidation (7:8). No pulmonary nodule. No pulmonary mass. Trace left pleural effusion. No right pleural effusion. No pneumothorax. Musculoskeletal: No chest wall abnormality. No suspicious lytic or blastic osseous lesions. No acute displaced fracture. Review of the MIP images confirms the above findings. CT ABDOMEN and  PELVIS FINDINGS Hepatobiliary: No focal liver abnormality. No gallstones, gallbladder wall thickening, or pericholecystic fluid. No biliary dilatation. Pancreas: No focal lesion. Normal pancreatic contour. No surrounding inflammatory changes. No main pancreatic ductal dilatation. Spleen: Normal in size without focal abnormality. Adrenals/Urinary Tract: No adrenal nodule bilaterally. Bilateral kidneys enhance symmetrically. No hydronephrosis. No hydroureter. The urinary bladder is unremarkable. On delayed imaging, there is no urothelial wall thickening and there are no filling defects in the opacified portions of the bilateral collecting systems or ureters. Stomach/Bowel: No evidence of bowel wall thickening or dilatation. Fluid dilatation of the appendiceal tip measuring 3.5 x 1.8 x 5 cm (2:51, 4:40). Vascular/Lymphatic: No abdominal aorta or iliac aneurysm. No abdominal, pelvic, or inguinal lymphadenopathy. Reproductive: Prostate is unremarkable. Other: Nonspecific 1.5 cm soft tissue density within the left lower abdominal mesentery (2:34). No intraperitoneal free fluid. No intraperitoneal free gas. No organized fluid collection. Musculoskeletal: No abdominal wall hernia or abnormality. No suspicious lytic or blastic osseous  lesions. No acute displaced fracture. Review of the MIP images confirms the above findings. IMPRESSION: 1. 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 Slightly limited evaluation due to timing of contrast on CT PA. Recommend emergent surgical consultation. 2. Fluid dilatation of the appendiceal tip measuring 3.5 x 1.8 x 5 cm. 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 Recommend surgical consultation. 3. Nonspecific 1.5 cm soft tissue density within the left lower abdominal mesentery. 4. No pulmonary embolus. 5. Left upper lobe peribronchovascular consolidation may represent infection/inflammation (such as aspiration pneumonia). 6. Trace left pleural effusion. These results were called by telephone at the time of interpretation on 07/29/2021 at 6:48 pm to provider [facility] , who verbally acknowledged these results. Electronically Signed   By: Tish Frederickson M.D.   On: 07/29/2021 19:02   CT ABDOMEN PELVIS W CONTRAST  Result Date: 07/29/2021 CLINICAL DATA:  Pulmonary embolism (PE) suspected, high prob; Epigastric pain CP, abd pain, recent Hernia surgery EXAM: CT ANGIOGRAPHY CHEST CT ABDOMEN AND PELVIS WITH CONTRAST TECHNIQUE: Multidetector CT imaging of the chest was performed using the standard protocol during bolus administration of intravenous contrast. Multiplanar CT image reconstructions and MIPs were obtained to evaluate the vascular anatomy. Multidetector CT imaging of the abdomen and pelvis was performed using the standard protocol during bolus administration of intravenous contrast. CONTRAST:  78mL OMNIPAQUE IOHEXOL 350 MG/ML SOLN COMPARISON:  CT angio chest 04/30/2020 FINDINGS: CTA CHEST FINDINGS Cardiovascular: Satisfactory opacification of the pulmonary arteries to the segmental level. No evidence of pulmonary embolism. Normal heart size. No significant pericardial effusion. The thoracic aorta is normal in caliber. No atherosclerotic plaque of the thoracic aorta. No  coronary artery calcifications. Mediastinum/Nodes: No enlarged mediastinal, hilar, or axillary lymph nodes. Thyroid gland, trachea, and esophagus demonstrate no significant findings. Large hiatal hernia containing the entire gastric lumen which is dilated within air-fluid level. Poor enhancement of the stomach wall with associated surrounding free fluid. Lungs/Pleura: Left lower lobe atelectasis. Left upper lobe peribronchovascular consolidation (7:8). No pulmonary nodule. No pulmonary mass. Trace left pleural effusion. No right pleural effusion. No pneumothorax. Musculoskeletal: No chest wall abnormality. No suspicious lytic or blastic osseous lesions. No acute displaced fracture. Review of the MIP images confirms the above findings. CT ABDOMEN and PELVIS FINDINGS Hepatobiliary: No focal liver abnormality. No gallstones, gallbladder wall thickening, or pericholecystic fluid. No biliary dilatation. Pancreas: No focal lesion. Normal pancreatic contour. No surrounding inflammatory changes. No main pancreatic ductal dilatation. Spleen: Normal in size without focal abnormality. Adrenals/Urinary Tract: No adrenal nodule bilaterally. Bilateral  kidneys enhance symmetrically. No hydronephrosis. No hydroureter. The urinary bladder is unremarkable. On delayed imaging, there is no urothelial wall thickening and there are no filling defects in the opacified portions of the bilateral collecting systems or ureters. Stomach/Bowel: No evidence of bowel wall thickening or dilatation. Fluid dilatation of the appendiceal tip measuring 3.5 x 1.8 x 5 cm (2:51, 4:40). Vascular/Lymphatic: No abdominal aorta or iliac aneurysm. No abdominal, pelvic, or inguinal lymphadenopathy. Reproductive: Prostate is unremarkable. Other: Nonspecific 1.5 cm soft tissue density within the left lower abdominal mesentery (2:34). No intraperitoneal free fluid. No intraperitoneal free gas. No organized fluid collection. Musculoskeletal: No abdominal wall  hernia or abnormality. No suspicious lytic or blastic osseous lesions. No acute displaced fracture. Review of the MIP images confirms the above findings. IMPRESSION: 1. 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 Slightly limited evaluation due to timing of contrast on CT PA. Recommend emergent surgical consultation. 2. Fluid dilatation of the appendiceal tip measuring 3.5 x 1.8 x 5 cm. 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 Recommend surgical consultation. 3. Nonspecific 1.5 cm soft tissue density within the left lower abdominal mesentery. 4. No pulmonary embolus. 5. Left upper lobe peribronchovascular consolidation may represent infection/inflammation (such as aspiration pneumonia). 6. Trace left pleural effusion. These results were called by telephone at the time of interpretation on 07/29/2021 at 6:48 pm to [facility] , who verbally acknowledged these results. Electronically Signed   By: Tish FredericksonMorgane  Naveau M.D.   On: 07/29/2021 19:02   DG Chest Port 1 View  Result Date: 07/30/2021 CLINICAL DATA:  Chest pain EXAM: PORTABLE CHEST 1 VIEW COMPARISON:  CT 613 p.m. FINDINGS: Gas containing soft tissue mass overlies the cardiac silhouette and left lung base in keeping with a large hiatal hernia better seen on recent CT examination. Small left pleural effusion is present. Associated left basilar atelectasis. Right lung is clear. No pneumothorax. No pleural effusion on the right. Cardiac size is within normal limits. IMPRESSION: Large hiatal hernia, similar to that seen on prior CT examination. Small left pleural effusion. Electronically Signed   By: Helyn NumbersAshesh  Parikh M.D.   On: 07/30/2021 00:57   DG Abd Portable 2V  Result Date: 07/29/2021 CLINICAL DATA:  Chest pain on respiration.  Hernia repair. EXAM: PORTABLE ABDOMEN - 2 VIEW COMPARISON:  CT of the abdomen pelvis 07/29/2021 and upper GI barium study 07/25/2021. FINDINGS: Dilated stomach noted above the diaphragm. This remains  concerning for gastric volvulus as described on CT. Contrast is present within the colon. Relative paucity of bowel gas noted otherwise. IMPRESSION: Dilated stomach above the diaphragm remains concerning for gastric volvulus with increased distension of the stomach compared to the upper GI study 4 days ago. Electronically Signed   By: Marin Robertshristopher  Mattern M.D.   On: 07/29/2021 20:30     Impression/ Recommendations: Acute abdominal pain and chest pain yesterday, now localized to lower chest pain, with persistent nausea and vomiting 6 days following redo take down of an incarcerated hiatal hernia with gastropexy. CT yesterday shows a large hiatal hernia and findings consistent with a gastric volvulus. Surgeries, CT findings and management discussed in detail with Dr. [name] DeutscherMartin, the patient and his wife. Urgent EGD is indicated for diagnostic and potentially therapeutic purposes. Although EGD can often temporarily reduce a gastric volvulus given intrathoracic gastric location and prior surgery EGD likely will not be able to reduce in this situation. Surgical mgmt may be required.      LOS: 1 day   [place]sa Marian T. Russella DarStark  MD 07/30/2021, 9:27 AM See Loretha Stapler, [facility], to contact our on call provider

## 2021-07-30 NOTE — Transfer of Care (Signed)
Immediate Anesthesia Transfer of Care Note  Patient: Raymond Watson  Procedure(s) Performed: ESOPHAGOGASTRODUODENOSCOPY (EGD) WITH PROPOFOL  Patient Location: Endoscopy Unit  Anesthesia Type:MAC  Level of Consciousness: awake, alert , oriented and patient cooperative  Airway & Oxygen Therapy: Patient Spontanous Breathing and Patient connected to face mask  Post-op Assessment: Report given to RN and Post -op Vital signs reviewed and stable  Post vital signs: Reviewed and stable  Last Vitals:  Vitals Value Taken Time  BP    Temp    Pulse    Resp    SpO2      Last Pain:  Vitals:   07/30/21 1022  TempSrc: Oral  PainSc: 6       Patients Stated Pain Goal: 3 (00/16/42 9037)  Complications: No notable events documented.

## 2021-07-31 ENCOUNTER — Encounter (HOSPITAL_COMMUNITY): Payer: Self-pay | Admitting: Gastroenterology

## 2021-07-31 ENCOUNTER — Inpatient Hospital Stay: Payer: Self-pay

## 2021-07-31 LAB — CBC WITH DIFFERENTIAL/PLATELET
Abs Immature Granulocytes: 0.03 10*3/uL (ref 0.00–0.07)
Basophils Absolute: 0 10*3/uL (ref 0.0–0.1)
Basophils Relative: 0 %
Eosinophils Absolute: 0.1 10*3/uL (ref 0.0–0.5)
Eosinophils Relative: 1 %
HCT: 36.8 % — ABNORMAL LOW (ref 39.0–52.0)
Hemoglobin: 11.7 g/dL — ABNORMAL LOW (ref 13.0–17.0)
Immature Granulocytes: 0 %
Lymphocytes Relative: 17 %
Lymphs Abs: 1.5 10*3/uL (ref 0.7–4.0)
MCH: 26.7 pg (ref 26.0–34.0)
MCHC: 31.8 g/dL (ref 30.0–36.0)
MCV: 83.8 fL (ref 80.0–100.0)
Monocytes Absolute: 1 10*3/uL (ref 0.1–1.0)
Monocytes Relative: 11 %
Neutro Abs: 6.3 10*3/uL (ref 1.7–7.7)
Neutrophils Relative %: 71 %
Platelets: 154 10*3/uL (ref 150–400)
RBC: 4.39 MIL/uL (ref 4.22–5.81)
RDW: 15.6 % — ABNORMAL HIGH (ref 11.5–15.5)
WBC: 8.9 10*3/uL (ref 4.0–10.5)
nRBC: 0 % (ref 0.0–0.2)

## 2021-07-31 LAB — BASIC METABOLIC PANEL
Anion gap: 6 (ref 5–15)
BUN: 15 mg/dL (ref 6–20)
CO2: 26 mmol/L (ref 22–32)
Calcium: 8.4 mg/dL — ABNORMAL LOW (ref 8.9–10.3)
Chloride: 107 mmol/L (ref 98–111)
Creatinine, Ser: 1.07 mg/dL (ref 0.61–1.24)
GFR, Estimated: >60 mL/min (ref >60)
Glucose, Bld: 106 mg/dL — ABNORMAL HIGH (ref 70–99)
Potassium: 4.5 mmol/L (ref 3.5–5.1)
Sodium: 139 mmol/L (ref 135–145)

## 2021-07-31 LAB — I-STAT BETA HCG BLOOD, ED (NOT ORDERABLE): I-stat hCG, quantitative: <5 m[IU]/mL (ref <5)

## 2021-07-31 MED ORDER — ALUM HYDROXIDE-MAG CARBONATE 95-358 MG/15ML PO SUSP: 30.0000 mL | Freq: Once | ORAL | Status: DC

## 2021-07-31 MED ORDER — KCL IN DEXTROSE-NACL 20-5-0.9 MEQ/L-%-% IV SOLN
INTRAVENOUS | Status: AC
  Filled 2021-07-31 (×2): qty 1000

## 2021-07-31 MED ORDER — INSULIN ASPART 100 UNIT/ML IJ SOLN
0.0000 [IU] | Freq: Four times a day (QID) | INTRAMUSCULAR | Status: DC
  Administered 2021-08-01 (×2): 1 [IU] via SUBCUTANEOUS
  Administered 2021-08-01: 2 [IU] via SUBCUTANEOUS
  Administered 2021-08-02: 1 [IU] via SUBCUTANEOUS

## 2021-07-31 MED ORDER — SODIUM CHLORIDE 0.9% FLUSH
10.0000 mL | Freq: Two times a day (BID) | INTRAVENOUS | Status: DC
  Administered 2021-07-31 – 2021-08-05 (×8): 10 mL

## 2021-07-31 MED ORDER — SODIUM CHLORIDE 0.9% FLUSH
10.0000 mL | INTRAVENOUS | Status: DC | PRN
  Administered 2021-08-03: 10 mL

## 2021-07-31 MED ORDER — TRAVASOL 10 % IV SOLN
INTRAVENOUS | Status: AC
  Filled 2021-07-31: qty 480

## 2021-07-31 MED ORDER — CHLORHEXIDINE GLUCONATE CLOTH 2 % EX PADS
6.0000 | MEDICATED_PAD | Freq: Every day | CUTANEOUS | Status: DC
  Administered 2021-07-31 – 2021-08-07 (×8): 6 via TOPICAL

## 2021-07-31 MED ORDER — ALUM & MAG HYDROXIDE-SIMETH 200-200-20 MG/5ML PO SUSP
30.0000 mL | Freq: Once | ORAL | Status: AC
  Administered 2021-07-31: 30 mL via ORAL
  Filled 2021-07-31: qty 30

## 2021-07-31 NOTE — Progress Notes (Signed)
Gaviscon 30 mL suspension ordered per MD. Pharmacist discontinued Gaviscon and ordered Maalox/Mylanta 30 mL as a Therapeutic Substitute.  Sinclair Ship, RN

## 2021-07-31 NOTE — Progress Notes (Signed)
PHARMACY - TOTAL PARENTERAL NUTRITION CONSULT NOTE   Indication: gastric volvulus  Patient Measurements: Height: 5\' 4"  (162.6 cm) Weight: 63.6 kg (140 lb 3.4 oz) IBW/kg (Calculated) : 59.2 TPN AdjBW (KG): 63.6 Body mass index is 24.07 kg/m. Usual Weight:   Assessment: 51 yo M Acute abdominal pain and chest pain 07/29/21.  On 1/3: localized to lower chest pain, with persistent nausea and vomiting 6 days following redo take down of an incarcerated hiatal hernia with gastropexy. CT  1/1 shows a large hiatal hernia and findings consistent with a gastric volvulus.  1/2 urgent EGD: gastric lumen fully decompressed & NGT placed Pharmacy consulted to manage TPN to provide nutrition in setting of gastric volvulus.   Glucose / Insulin: no hx DM, CBGs < 150 on D5NS 20 K @ 100 ml/hr Electrolytes: WNL, Ca 8.4, CoCa 8.96 Renal: SCr 1.07 Hepatic:  LFTS WNL 1/1  Intake / Output; MIVF: D5NS 20 K @ 100 ml/hr I/O + 1019, no NGO recorded yet GI Imaging: CT 1/1 shows a large hiatal hernia and findings consistent with a gastric volvulus GI Surgeries / Procedures:  12/27  Takedown Nissen, reduction of incarcerated stomach in the chest with partial anterior diaphragmatic closure and gastropexy  1/2 : 1/2 urgent EGD: gastric lumen fulled decompressed & NGT placed, unable to reduce the gastric volvulus  Central access: PICC ordered 1/3 for TPN TPN start date: 1/3 if PICC placed  Nutritional Goals: Goal TPN rate is 80 mL/hr (provides 96 g of protein and 1842 kcals per day)  RD Assessment:  will f/u   Current Nutrition:  NPO  Plan:  Start TPN at 40 mL/hr at 1800 and assess tolerance Electrolytes in TPN: Na 68mEq/L, K 88mEq/L, Ca 72mEq/L, Mg 51mEq/L, and Phos 86mmol/L. Cl:Ac 1:1 Add standard MVI and trace elements to TPN Initiate Sensitive q6h SSI and adjust as needed  Reduce MIVF to 60 mL/hr at 1800 to keep total IVF 100 ml/hr or per CCS Monitor TPN labs on Mon/Thurs F/u with RD for goals  12m, Pharm.D 07/31/2021 10:59 AM

## 2021-07-31 NOTE — Progress Notes (Signed)
°   07/31/21 0751  Pain Assessment  Pain Scale 0-10  Pain Score 8  Pain Type Acute pain  Pain Location Chest  Pain Orientation Left  Pain Descriptors / Indicators Sharp  Pain Frequency Constant  Pain Onset On-going  Patients Stated Pain Goal 3  Pain Intervention(s) MD notified (Comment)  Provider Notification  Provider Name/Title Johnathan Hausen, MD  Date Provider Notified 07/31/21  Time Provider Notified 0800  Notification Type Call  Notification Reason Change in status  Provider response See new orders  Date of Provider Response 07/31/21  Time of Provider Response 0814   EKG performed, MD notified of chest pain and EKG results. New orders of Gaviscon 30 cc (one time dose), CBC with Differential, and Basic Metabolic Panel. Will continue to monitor.  Layla Maw, RN

## 2021-07-31 NOTE — Progress Notes (Signed)
Patient ID: Raymond Watson, male   DOB: Jan 06, 1971, 51 y.o.   MRN: [id number] [facility] Surgery Progress Note:   1 Day Post-Op  Subjective: Mental status is clear.  Complaints he complained of chest pain when the NG was turned off --I flushed it and reconnected it. Objective: Vital signs in last 24 hours: Temp:  [97.7 F (36.5 C)-99.1 F (37.3 C)] 98.2 F (36.8 C) (01/03 1152) Pulse Rate:  [91-104] 104 (01/03 1152) Resp:  [16-30] 22 (01/03 1152) BP: (99-126)/(76-99) 117/92 (01/03 1152) SpO2:  [89 %-98 %] 89 % (01/03 1152)  Intake/Output from previous day: 01/02 0701 - 01/03 0700 In: 1469.4 [I.V.:1469.4] Out: 450 [Urine:450] Intake/Output this shift: No intake/output data recorded.  Physical Exam: Work of breathing is OK.  Incisions OK.  Abdomen nontender  Lab Results:  Results for orders placed or performed during the hospital encounter of 07/29/21 (from the past 48 hour(s))  CBC with Differential     Status: Abnormal   Collection Time: 07/29/21  4:56 PM  Result Value Ref Range   WBC 9.7 4.0 - 10.5 K/uL   RBC 5.72 4.22 - 5.81 MIL/uL   Hemoglobin 15.1 13.0 - 17.0 g/dL   HCT 46.6 39.0 - 52.0 %   MCV 81.5 80.0 - 100.0 fL   MCH 26.4 26.0 - 34.0 pg   MCHC 32.4 30.0 - 36.0 g/dL   RDW 15.4 11.5 - 15.5 %   Platelets 210 150 - 400 K/uL   nRBC 0.0 0.0 - 0.2 %   Neutrophils Relative % 82 %   Neutro Abs 7.9 (H) 1.7 - 7.7 K/uL   Lymphocytes Relative 12 %   Lymphs Abs 1.1 0.7 - 4.0 K/uL   Monocytes Relative 6 %   Monocytes Absolute 0.5 0.1 - 1.0 K/uL   Eosinophils Relative 0 %   Eosinophils Absolute 0.0 0.0 - 0.5 K/uL   Basophils Relative 0 %   Basophils Absolute 0.0 0.0 - 0.1 K/uL   Immature Granulocytes 0 %   Abs Immature Granulocytes 0.04 0.00 - 0.07 K/uL    Comment: Performed at [facility], [place] [address] [place], [place] [address]  Lactic acid, plasma     Status: Abnormal   Collection Time: 07/29/21  4:56 PM  Result Value Ref Range   Lactic Acid,  Venous 2.6 (HH) 0.5 - 1.9 mmol/L    Comment: CRITICAL RESULT CALLED TO, READ BACK BY AND VERIFIED WITH: CARLY, RN @ 1806 ON 07/29/2020 BY Ruffin Frederick, MLT Performed at [facility], [place] [address] [place], [place] [address]   Blood culture (routine x 2)     Status: None (Preliminary result)   Collection Time: 07/29/21  5:00 PM   Specimen: BLOOD  Result Value Ref Range   Specimen Description      BLOOD RIGHT ANTECUBITAL Performed at [place] [address], [place], [place] [address]    Special Requests      BOTTLES DRAWN AEROBIC AND ANAEROBIC Blood Culture results may not be optimal due to an inadequate volume of blood received in culture bottles Performed at [facility], [place] [address], [place], [place] [address]    Culture      NO GROWTH 2 DAYS Performed at [facility], [place] [address], [place], [place] [address]    Report Status PENDING   Blood culture (routine x 2)     Status: None (Preliminary result)   Collection Time: 07/29/21  5:01 PM  Specimen: BLOOD  Result Value Ref Range   Specimen Description      BLOOD LEFT ANTECUBITAL Performed at [place] [address], [place], [place] [address]    Special Requests      BOTTLES DRAWN AEROBIC AND ANAEROBIC Blood Culture results may not be optimal due to an inadequate volume of blood received in culture bottles Performed at [facility], [place] [address] [place], [place] [address]    Culture      NO GROWTH 1 DAY Performed at [place] [address] [place], [place] [address]    Report Status PENDING   I-Stat beta hCG blood, ED ([facility], [facility], [facility] only)     Status: None   Collection Time: 07/29/21  5:08 PM  Result Value Ref Range   I-stat hCG, quantitative <5.0 <5 mIU/mL   Comment 3            Comment:   GEST. AGE      CONC.  (mIU/mL)   <=1 WEEK        5 - 50     2 WEEKS       50 - 500     3 WEEKS       100 -  10,000     4 WEEKS     1,000 - 30,000        MALE AND NON-PREGNANT MALE:     LESS THAN 5 mIU/mL   I-Stat beta hCG blood, ED     Status: None   Collection Time: 07/29/21  5:08 PM  Result Value Ref Range   I-stat hCG, quantitative <5.0 <5 mIU/mL    Comment: THIS TEST WAS ORDERED IN ERROR AND HAS BEEN CREDITED. Performed at [facility], [place] [address], [place], [place] [address]    Comment 3            Comment:   GEST. AGE      CONC.  (mIU/mL)   <=1 WEEK        5 - 50     2 WEEKS       50 - 500     3 WEEKS       100 - 10,000     4 WEEKS     1,000 - 30,000        MALE AND NON-PREGNANT MALE:     LESS THAN 5 mIU/mL   I-stat chem 8, ED (not at [facility] or [facility])     Status: Abnormal   Collection Time: 07/29/21  5:47 PM  Result Value Ref Range   Sodium 134 (L) 135 - 145 mmol/L   Potassium 4.6 3.5 - 5.1 mmol/L   Chloride 103 98 - 111 mmol/L   BUN 33 (H) 6 - 20 mg/dL   Creatinine, Ser 1.30 (H) 0.61 - 1.24 mg/dL   Glucose, Bld 111 (H) 70 - 99 mg/dL    Comment: Glucose reference range applies only to samples taken after fasting for at least 8 hours.   Calcium, Ion 1.03 (L) 1.15 - 1.40 mmol/L   TCO2 25 22 - 32 mmol/L   Hemoglobin 16.0 13.0 - 17.0 g/dL   HCT 47.0 39.0 - 52.0 %  Lactic acid, plasma     Status: None   Collection Time: 07/29/21  6:56 PM  Result Value Ref Range   Lactic Acid, Venous 1.9 0.5 - 1.9 mmol/L    Comment: Performed at [facility], [address] [place], [place] [address]  Comprehensive metabolic panel     Status: Abnormal   Collection Time: 07/29/21  7:15 PM  Result Value Ref Range   Sodium 136 135 - 145 mmol/L   Potassium 4.0 3.5 - 5.1 mmol/L   Chloride 104 98 - 111 mmol/L   CO2 25 22 - 32 mmol/L   Glucose, Bld 116 (H) 70 - 99 mg/dL    Comment: Glucose reference range applies only to samples taken after fasting for at least 8 hours.   BUN 26 (H) 6 - 20 mg/dL   Creatinine, Ser 1.27 (H) 0.61 - 1.24 mg/dL   Calcium 8.7 (L) 8.9 -  10.3 mg/dL   Total Protein 7.5 6.5 - 8.1 g/dL   Albumin 3.9 3.5 - 5.0 g/dL   AST 24 15 - 41 U/L   ALT 23 0 - 44 U/L   Alkaline Phosphatase 71 38 - 126 U/L   Total Bilirubin 1.1 0.3 - 1.2 mg/dL   GFR, Estimated >60 >60 mL/min    Comment: (NOTE) Calculated using the CKD-EPI Creatinine Equation (2021)    Anion gap 7 5 - 15    Comment: Performed at [facility], [place] [address], [place], [place] [address]  Lipase, blood     Status: None   Collection Time: 07/29/21  7:15 PM  Result Value Ref Range   Lipase 26 11 - 51 U/L    Comment: Performed at [facility], [place] [address], [place], [place] [address]  Troponin I (High Sensitivity)     Status: None   Collection Time: 07/29/21  7:15 PM  Result Value Ref Range   Troponin I (High Sensitivity) 11 <18 ng/L    Comment: (NOTE) Elevated high sensitivity troponin I (hsTnI) values and significant  changes across serial measurements may suggest ACS but many other  chronic and acute conditions are known to elevate hsTnI results.  Refer to the "Links" section for chest pain algorithms and additional  guidance. Performed at [facility], [place] [address] [place], [place] [address]   HIV Antibody (routine testing w rflx)     Status: None   Collection Time: 07/30/21 12:48 AM  Result Value Ref Range   HIV Screen 4th Generation wRfx Non Reactive Non Reactive    Comment: Performed at [facility], [place] [address] [place],  [address]  Comprehensive metabolic panel     Status: Abnormal   Collection Time: 07/30/21 12:48 AM  Result Value Ref Range   Sodium 137 135 - 145 mmol/L   Potassium 4.4 3.5 - 5.1 mmol/L   Chloride 109 98 - 111 mmol/L   CO2 22 22 - 32 mmol/L   Glucose, Bld 125 (H) 70 - 99 mg/dL    Comment: Glucose reference range applies only to samples taken after fasting for at least 8 hours.   BUN 22 (H) 6 - 20 mg/dL   Creatinine, Ser 1.09 0.61 - 1.24 mg/dL   Calcium 8.1  (L) 8.9 - 10.3 mg/dL   Total Protein 6.4 (L) 6.5 - 8.1 g/dL   Albumin 3.3 (L) 3.5 - 5.0 g/dL   AST 17 15 - 41 U/L   ALT 19 0 - 44 U/L   Alkaline Phosphatase 62 38 - 126 U/L   Total Bilirubin 0.9 0.3 - 1.2 mg/dL   GFR, Estimated >60 >60 mL/min    Comment: (NOTE) Calculated using the CKD-EPI Creatinine Equation (2021)    Anion gap  6 5 - 15    Comment: Performed at [facility], [place] [address] [place], [place] [address]  CBC WITH DIFFERENTIAL     Status: Abnormal   Collection Time: 07/30/21 12:48 AM  Result Value Ref Range   WBC 9.6 4.0 - 10.5 K/uL   RBC 4.57 4.22 - 5.81 MIL/uL   Hemoglobin 12.0 (L) 13.0 - 17.0 g/dL   HCT 36.9 (L) 39.0 - 52.0 %   MCV 80.7 80.0 - 100.0 fL   MCH 26.3 26.0 - 34.0 pg   MCHC 32.5 30.0 - 36.0 g/dL   RDW 15.1 11.5 - 15.5 %   Platelets 179 150 - 400 K/uL   nRBC 0.0 0.0 - 0.2 %   Neutrophils Relative % 71 %   Neutro Abs 6.7 1.7 - 7.7 K/uL   Lymphocytes Relative 18 %   Lymphs Abs 1.7 0.7 - 4.0 K/uL   Monocytes Relative 11 %   Monocytes Absolute 1.1 (H) 0.1 - 1.0 K/uL   Eosinophils Relative 0 %   Eosinophils Absolute 0.0 0.0 - 0.5 K/uL   Basophils Relative 0 %   Basophils Absolute 0.0 0.0 - 0.1 K/uL   Immature Granulocytes 0 %   Abs Immature Granulocytes 0.04 0.00 - 0.07 K/uL    Comment: Performed at [facility], [place] [address], [place], [place] [address]  Resp Panel by RT-PCR (Flu A&B, Covid) Nasopharyngeal Swab     Status: None   Collection Time: 07/30/21  9:23 AM   Specimen: Nasopharyngeal Swab; Nasopharyngeal(NP) swabs in vial transport medium  Result Value Ref Range   SARS Coronavirus 2 by RT PCR NEGATIVE NEGATIVE    Comment: (NOTE) SARS-CoV-2 target nucleic acids are NOT DETECTED.  The SARS-CoV-2 RNA is generally detectable in upper respiratory specimens during the acute phase of infection. The lowest concentration of SARS-CoV-2 viral copies this assay can detect is 138 copies/mL. A negative result  does not preclude SARS-Cov-2 infection and should not be used as the sole basis for treatment or other patient management decisions. A negative result may occur with  improper specimen collection/handling, submission of specimen other than nasopharyngeal swab, presence of viral mutation(s) within the areas targeted by this assay, and inadequate number of viral copies(<138 copies/mL). A negative result must be combined with clinical observations, patient history, and epidemiological information. The expected result is Negative.  Fact Sheet for Patients:  EntrepreneurPulse.com.au  Fact Sheet for Healthcare Providers:  IncredibleEmployment.be  This test is no t yet approved or cleared by the [place] FDA and  has been authorized for detection and/or diagnosis of SARS-CoV-2 by FDA under an Emergency Use Authorization (EUA). This EUA will remain  in effect (meaning this test can be used) for the duration of the COVID-19 declaration under Section 564(b)(1) of the Act, 21 U.S.C.section 360bbb-3(b)(1), unless the authorization is terminated  or revoked sooner.       Influenza A by PCR NEGATIVE NEGATIVE   Influenza B by PCR NEGATIVE NEGATIVE    Comment: (NOTE) The Xpert Xpress SARS-CoV-2/FLU/RSV plus assay is intended as an aid in the diagnosis of influenza from Nasopharyngeal swab specimens and should not be used as a sole basis for treatment. Nasal washings and aspirates are unacceptable for Xpert Xpress SARS-CoV-2/FLU/RSV testing.  Fact Sheet for Patients: EntrepreneurPulse.com.au  Fact Sheet for Healthcare Providers: IncredibleEmployment.be  This test is not yet approved or cleared by the [place] FDA and has been authorized for detection and/or diagnosis of SARS-CoV-2 by FDA  under an Emergency Use Authorization (EUA). This EUA will remain in effect (meaning this test can be used) for the duration  of the COVID-19 declaration under Section 564(b)(1) of the Act, 21 U.S.C. section 360bbb-3(b)(1), unless the authorization is terminated or revoked.  Performed at [facility], [address] [place], [place] [address]   Basic metabolic panel     Status: Abnormal   Collection Time: 07/31/21  8:45 AM  Result Value Ref Range   Sodium 139 135 - 145 mmol/L   Potassium 4.5 3.5 - 5.1 mmol/L   Chloride 107 98 - 111 mmol/L   CO2 26 22 - 32 mmol/L   Glucose, Bld 106 (H) 70 - 99 mg/dL    Comment: Glucose reference range applies only to samples taken after fasting for at least 8 hours.   BUN 15 6 - 20 mg/dL   Creatinine, Ser 1.911.07 0.61 - 1.24 mg/dL   Calcium 8.4 (L) 8.9 - 10.3 mg/dL   GFR, Estimated >47>60 >82>60 mL/min    Comment: (NOTE) Calculated using the CKD-EPI Creatinine Equation (2021)    Anion gap 6 5 - 15    Comment: Performed at [facility], [address] [place], [place] [address]  CBC with Differential/Platelet     Status: Abnormal   Collection Time: 07/31/21  8:45 AM  Result Value Ref Range   WBC 8.9 4.0 - 10.5 K/uL   RBC 4.39 4.22 - 5.81 MIL/uL   Hemoglobin 11.7 (L) 13.0 - 17.0 g/dL   HCT 30.836.8 (L) 65.739.0 - 84.652.0 %   MCV 83.8 80.0 - 100.0 fL   MCH 26.7 26.0 - 34.0 pg   MCHC 31.8 30.0 - 36.0 g/dL   RDW 96.215.6 (H) 95.211.5 - 84.115.5 %   Platelets 154 150 - 400 K/uL   nRBC 0.0 0.0 - 0.2 %   Neutrophils Relative % 71 %   Neutro Abs 6.3 1.7 - 7.7 K/uL   Lymphocytes Relative 17 %   Lymphs Abs 1.5 0.7 - 4.0 K/uL   Monocytes Relative 11 %   Monocytes Absolute 1.0 0.1 - 1.0 K/uL   Eosinophils Relative 1 %   Eosinophils Absolute 0.1 0.0 - 0.5 K/uL   Basophils Relative 0 %   Basophils Absolute 0.0 0.0 - 0.1 K/uL   Immature Granulocytes 0 %   Abs Immature Granulocytes 0.03 0.00 - 0.07 K/uL    Comment: Performed at [facility], [address] [place], [place] [address]    Radiology/Results: CT Angio Chest PE W and/or Wo  Contrast  Result Date: 07/29/2021 CLINICAL DATA:  Pulmonary embolism (PE) suspected, high prob; Epigastric pain CP, abd pain, recent Hernia surgery EXAM: CT ANGIOGRAPHY CHEST CT ABDOMEN AND PELVIS WITH CONTRAST TECHNIQUE: Multidetector CT imaging of the chest was performed using the standard protocol during bolus administration of intravenous contrast. Multiplanar CT image reconstructions and MIPs were obtained to evaluate the vascular anatomy. Multidetector CT imaging of the abdomen and pelvis was performed using the standard protocol during bolus administration of intravenous contrast. CONTRAST:  80mL OMNIPAQUE IOHEXOL 350 MG/ML SOLN COMPARISON:  CT angio chest 04/30/2020 FINDINGS: CTA CHEST FINDINGS Cardiovascular: Satisfactory opacification of the pulmonary arteries to the segmental level. No evidence of pulmonary embolism. Normal heart size. No significant pericardial effusion. The thoracic aorta is normal in caliber. No atherosclerotic plaque of the thoracic aorta. No coronary artery calcifications. Mediastinum/Nodes: No enlarged mediastinal, hilar, or axillary lymph nodes. Thyroid gland, trachea, and esophagus demonstrate no significant  findings. Large hiatal hernia containing the entire gastric lumen which is dilated within air-fluid level. Poor enhancement of the stomach wall with associated surrounding free fluid. Lungs/Pleura: Left lower lobe atelectasis. Left upper lobe peribronchovascular consolidation (7:8). No pulmonary nodule. No pulmonary mass. Trace left pleural effusion. No right pleural effusion. No pneumothorax. Musculoskeletal: No chest wall abnormality. No suspicious lytic or blastic osseous lesions. No acute displaced fracture. Review of the MIP images confirms the above findings. CT ABDOMEN and PELVIS FINDINGS Hepatobiliary: No focal liver abnormality. No gallstones, gallbladder wall thickening, or pericholecystic fluid. No biliary dilatation. Pancreas: No focal lesion. Normal pancreatic  contour. No surrounding inflammatory changes. No main pancreatic ductal dilatation. Spleen: Normal in size without focal abnormality. Adrenals/Urinary Tract: No adrenal nodule bilaterally. Bilateral kidneys enhance symmetrically. No hydronephrosis. No hydroureter. The urinary bladder is unremarkable. On delayed imaging, there is no urothelial wall thickening and there are no filling defects in the opacified portions of the bilateral collecting systems or ureters. Stomach/Bowel: No evidence of bowel wall thickening or dilatation. Fluid dilatation of the appendiceal tip measuring 3.5 x 1.8 x 5 cm (2:51, 4:40). Vascular/Lymphatic: No abdominal aorta or iliac aneurysm. No abdominal, pelvic, or inguinal lymphadenopathy. Reproductive: Prostate is unremarkable. Other: Nonspecific 1.5 cm soft tissue density within the left lower abdominal mesentery (2:34). No intraperitoneal free fluid. No intraperitoneal free gas. No organized fluid collection. Musculoskeletal: No abdominal wall hernia or abnormality. No suspicious lytic or blastic osseous lesions. No acute displaced fracture. Review of the MIP images confirms the above findings. IMPRESSION: 1. 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 Slightly limited evaluation due to timing of contrast on CT PA. Recommend emergent surgical consultation. 2. Fluid dilatation of the appendiceal tip measuring 3.5 x 1.8 x 5 cm. 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 Recommend surgical consultation. 3. Nonspecific 1.5 cm soft tissue density within the left lower abdominal mesentery. 4. No pulmonary embolus. 5. Left upper lobe peribronchovascular consolidation may represent infection/inflammation (such as aspiration pneumonia). 6. Trace left pleural effusion. These results were called by telephone at the time of interpretation on 07/29/2021 at 6:48 pm to provider [facility] , who verbally acknowledged these results. Electronically Signed   By: Iven Finn  M.D.   On: 07/29/2021 19:02   CT ABDOMEN PELVIS W CONTRAST  Result Date: 07/29/2021 CLINICAL DATA:  Pulmonary embolism (PE) suspected, high prob; Epigastric pain CP, abd pain, recent Hernia surgery EXAM: CT ANGIOGRAPHY CHEST CT ABDOMEN AND PELVIS WITH CONTRAST TECHNIQUE: Multidetector CT imaging of the chest was performed using the standard protocol during bolus administration of intravenous contrast. Multiplanar CT image reconstructions and MIPs were obtained to evaluate the vascular anatomy. Multidetector CT imaging of the abdomen and pelvis was performed using the standard protocol during bolus administration of intravenous contrast. CONTRAST:  89mL OMNIPAQUE IOHEXOL 350 MG/ML SOLN COMPARISON:  CT angio chest 04/30/2020 FINDINGS: CTA CHEST FINDINGS Cardiovascular: Satisfactory opacification of the pulmonary arteries to the segmental level. No evidence of pulmonary embolism. Normal heart size. No significant pericardial effusion. The thoracic aorta is normal in caliber. No atherosclerotic plaque of the thoracic aorta. No coronary artery calcifications. Mediastinum/Nodes: No enlarged mediastinal, hilar, or axillary lymph nodes. Thyroid gland, trachea, and esophagus demonstrate no significant findings. Large hiatal hernia containing the entire gastric lumen which is dilated within air-fluid level. Poor enhancement of the stomach wall with associated surrounding free fluid. Lungs/Pleura: Left lower lobe atelectasis. Left upper lobe peribronchovascular consolidation (7:8). No pulmonary nodule. No pulmonary mass. Trace left pleural effusion. No  right pleural effusion. No pneumothorax. Musculoskeletal: No chest wall abnormality. No suspicious lytic or blastic osseous lesions. No acute displaced fracture. Review of the MIP images confirms the above findings. CT ABDOMEN and PELVIS FINDINGS Hepatobiliary: No focal liver abnormality. No gallstones, gallbladder wall thickening, or pericholecystic fluid. No biliary  dilatation. Pancreas: No focal lesion. Normal pancreatic contour. No surrounding inflammatory changes. No main pancreatic ductal dilatation. Spleen: Normal in size without focal abnormality. Adrenals/Urinary Tract: No adrenal nodule bilaterally. Bilateral kidneys enhance symmetrically. No hydronephrosis. No hydroureter. The urinary bladder is unremarkable. On delayed imaging, there is no urothelial wall thickening and there are no filling defects in the opacified portions of the bilateral collecting systems or ureters. Stomach/Bowel: No evidence of bowel wall thickening or dilatation. Fluid dilatation of the appendiceal tip measuring 3.5 x 1.8 x 5 cm (2:51, 4:40). Vascular/Lymphatic: No abdominal aorta or iliac aneurysm. No abdominal, pelvic, or inguinal lymphadenopathy. Reproductive: Prostate is unremarkable. Other: Nonspecific 1.5 cm soft tissue density within the left lower abdominal mesentery (2:34). No intraperitoneal free fluid. No intraperitoneal free gas. No organized fluid collection. Musculoskeletal: No abdominal wall hernia or abnormality. No suspicious lytic or blastic osseous lesions. No acute displaced fracture. Review of the MIP images confirms the above findings. IMPRESSION: 1. 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 Slightly limited evaluation due to timing of contrast on CT PA. Recommend emergent surgical consultation. 2. Fluid dilatation of the appendiceal tip measuring 3.5 x 1.8 x 5 cm. 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 Recommend surgical consultation. 3. Nonspecific 1.5 cm soft tissue density within the left lower abdominal mesentery. 4. No pulmonary embolus. 5. Left upper lobe peribronchovascular consolidation may represent infection/inflammation (such as aspiration pneumonia). 6. Trace left pleural effusion. These results were called by telephone at the time of interpretation on 07/29/2021 at 6:48 pm to provider [facility] [place] [facility] , who verbally acknowledged  these results. Electronically Signed   By: Iven Finn M.D.   On: 07/29/2021 19:02   DG Chest Port 1 View  Result Date: 07/30/2021 CLINICAL DATA:  Chest pain EXAM: PORTABLE CHEST 1 VIEW COMPARISON:  CT 613 p.m. FINDINGS: Gas containing soft tissue mass overlies the cardiac silhouette and left lung base in keeping with a large hiatal hernia better seen on recent CT examination. Small left pleural effusion is present. Associated left basilar atelectasis. Right lung is clear. No pneumothorax. No pleural effusion on the right. Cardiac size is within normal limits. IMPRESSION: Large hiatal hernia, similar to that seen on prior CT examination. Small left pleural effusion. Electronically Signed   By: Fidela Salisbury M.D.   On: 07/30/2021 00:57   DG Abd Portable 2V  Result Date: 07/29/2021 CLINICAL DATA:  Chest pain on respiration.  Hernia repair. EXAM: PORTABLE ABDOMEN - 2 VIEW COMPARISON:  CT of the abdomen pelvis 07/29/2021 and upper GI barium study 07/25/2021. FINDINGS: Dilated stomach noted above the diaphragm. This remains concerning for gastric volvulus as described on CT. Contrast is present within the colon. Relative paucity of bowel gas noted otherwise. IMPRESSION: Dilated stomach above the diaphragm remains concerning for gastric volvulus with increased distension of the stomach compared to the upper GI study 4 days ago. Electronically Signed   By: San Morelle M.D.   On: 07/29/2021 20:30   [place] EKG SITE RITE  Result Date: 07/31/2021 If Site Rite image not attached, placement could not be confirmed due to current cardiac rhythm.   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  NG flushed and reconnected to suction.   1 Day Post-Op    LOS: 2 days   Matt B. Hassell Done, MD, [facility] [phone] to reach the surgeon on call.    07/31/2021 2:52 PM

## 2021-07-31 NOTE — Progress Notes (Signed)
Peripherally Inserted Central Catheter Placement  The IV Nurse has discussed with the patient and/or persons authorized to consent for the patient, the purpose of this procedure and the potential benefits and risks involved with this procedure.  The benefits include less needle sticks, lab draws from the catheter, and the patient may be discharged home with the catheter. Risks include, but not limited to, infection, bleeding, blood clot (thrombus formation), and puncture of an artery; nerve damage and irregular heartbeat and possibility to perform a PICC exchange if needed/ordered by physician.  Alternatives to this procedure were also discussed.  Bard Power PICC patient education guide, fact sheet on infection prevention and patient information card has been provided to patient /or left at bedside.    PICC Placement Documentation  PICC Double Lumen 07/31/21 PICC Right Brachial 36 cm 0 cm (Active)  Indication for Insertion or Continuance of Line Administration of hyperosmolar/irritating solutions (i.e. TPN, Vancomycin, etc.) 07/31/21 1435  Exposed Catheter (cm) 0 cm 07/31/21 1435  Site Assessment Clean;Dry;Intact 07/31/21 1435  Lumen #1 Status Flushed;Saline locked;Blood return noted 07/31/21 1435  Lumen #2 Status Flushed;Saline locked;Blood return noted 07/31/21 1435  Dressing Type Transparent;Securing device 07/31/21 1435  Dressing Status Clean;Dry;Intact 07/31/21 1435  Antimicrobial disc in place? Yes 07/31/21 1435  Safety Lock Not Applicable 07/31/21 1435  Dressing Intervention Other (Comment) 07/31/21 1435  Dressing Change Due 08/07/21 07/31/21 1435       Annett Fabian 07/31/2021, 2:36 PM

## 2021-08-01 LAB — CBC WITH DIFFERENTIAL/PLATELET
Abs Immature Granulocytes: 0.04 10*3/uL (ref 0.00–0.07)
Basophils Absolute: 0 10*3/uL (ref 0.0–0.1)
Basophils Relative: 0 %
Eosinophils Absolute: 0 10*3/uL (ref 0.0–0.5)
Eosinophils Relative: 0 %
HCT: 34.4 % — ABNORMAL LOW (ref 39.0–52.0)
Hemoglobin: 11 g/dL — ABNORMAL LOW (ref 13.0–17.0)
Immature Granulocytes: 1 %
Lymphocytes Relative: 19 %
Lymphs Abs: 1.5 10*3/uL (ref 0.7–4.0)
MCH: 26.7 pg (ref 26.0–34.0)
MCHC: 32 g/dL (ref 30.0–36.0)
MCV: 83.5 fL (ref 80.0–100.0)
Monocytes Absolute: 1.1 10*3/uL — ABNORMAL HIGH (ref 0.1–1.0)
Monocytes Relative: 15 %
Neutro Abs: 5 10*3/uL (ref 1.7–7.7)
Neutrophils Relative %: 65 %
Platelets: 138 10*3/uL — ABNORMAL LOW (ref 150–400)
RBC: 4.12 MIL/uL — ABNORMAL LOW (ref 4.22–5.81)
RDW: 15.3 % (ref 11.5–15.5)
WBC: 7.7 10*3/uL (ref 4.0–10.5)
nRBC: 0 % (ref 0.0–0.2)

## 2021-08-01 LAB — COMPREHENSIVE METABOLIC PANEL
ALT: 16 U/L (ref 0–44)
AST: 12 U/L — ABNORMAL LOW (ref 15–41)
Albumin: 3.1 g/dL — ABNORMAL LOW (ref 3.5–5.0)
Alkaline Phosphatase: 55 U/L (ref 38–126)
Anion gap: 6 (ref 5–15)
BUN: 16 mg/dL (ref 6–20)
CO2: 26 mmol/L (ref 22–32)
Calcium: 8.3 mg/dL — ABNORMAL LOW (ref 8.9–10.3)
Chloride: 105 mmol/L (ref 98–111)
Creatinine, Ser: 1.03 mg/dL (ref 0.61–1.24)
GFR, Estimated: >60 mL/min (ref >60)
Glucose, Bld: 104 mg/dL — ABNORMAL HIGH (ref 70–99)
Potassium: 4 mmol/L (ref 3.5–5.1)
Sodium: 137 mmol/L (ref 135–145)
Total Bilirubin: 1.1 mg/dL (ref 0.3–1.2)
Total Protein: 6 g/dL — ABNORMAL LOW (ref 6.5–8.1)

## 2021-08-01 LAB — GLUCOSE, CAPILLARY
Glucose-Capillary: 110 mg/dL — ABNORMAL HIGH (ref 70–99)
Glucose-Capillary: 128 mg/dL — ABNORMAL HIGH (ref 70–99)
Glucose-Capillary: 128 mg/dL — ABNORMAL HIGH (ref 70–99)
Glucose-Capillary: 180 mg/dL — ABNORMAL HIGH (ref 70–99)

## 2021-08-01 LAB — TRIGLYCERIDES: Triglycerides: 47 mg/dL (ref <150)

## 2021-08-01 LAB — MAGNESIUM: Magnesium: 2.2 mg/dL (ref 1.7–2.4)

## 2021-08-01 LAB — PHOSPHORUS: Phosphorus: 3.3 mg/dL (ref 2.5–4.6)

## 2021-08-01 MED ORDER — SODIUM CHLORIDE 0.9 % IV SOLN: INTRAVENOUS | Status: DC

## 2021-08-01 MED ORDER — TRAVASOL 10 % IV SOLN
INTRAVENOUS | Status: DC
  Filled 2021-08-01: qty 960

## 2021-08-01 MED ORDER — DEXTROSE 10 % IV SOLN: INTRAVENOUS | Status: AC

## 2021-08-01 NOTE — Progress Notes (Signed)
Initial Nutrition Assessment  DOCUMENTATION CODES:   Non-severe (moderate) malnutrition in context of acute illness/injury  INTERVENTION:  - TPN regimen per Pharmacist. - will monitor for nutrition-related plan moving forward.    NUTRITION DIAGNOSIS:   Moderate Malnutrition related to acute illness, altered GI function as evidenced by mild fat depletion, mild muscle depletion, moderate muscle depletion.  GOAL:   Patient will meet greater than or equal to 90% of their needs  MONITOR:   Diet advancement, Labs, Weight trends, I & O's, Other (Comment) (TPN regimen)  REASON FOR ASSESSMENT:   Consult New TPN/TNA  ASSESSMENT:   51 year old male with medical history of HTN, HLD, GERD, headaches, and hernia s/p repair. Patient was admitted at the end of 06/2021 and discharged on 07/28/21 after takedown Nissen, reduction of incarcerated stomach in the chest with partial anterior diaphragmatic closure and gastropexy on 07/24/21. He returned to the ED on 1/1 due to N/V, chest pain, and abdominal pain.  NGT placed in R nare on 1/2 and patient has had 200 ml output overnight.   Double lumen PICC placed in R brachial yesterday and he was started on custom TPN @ 40 ml/hr yesterday at 1800. Pharmacist's note from this AM outlines plan to increase to goal rate of 80 ml/hr today to provide 1842 kcal (88% kcal need) and 96 grams protein (91% protein need).   Patient laying in bed with his wife at bedside. Both provide information although conversation more heavily had with wife d/t patient having through pain d/t NGT which makes it uncomfortable to talk much.   He had surgery on 07/24/21 as outlined above. Diet was advanced post-op and was Soft diet order on date of d/c of 12/31. Wife relays that patient was unable to tolerate solid foods and that he was having difficulty even with liquids. He was consuming small amount of applesauce, juice, water, ice cream, and pudding prior to d/c.   On 1/1 at  home he had some apple juice, water, a small amount of hard boiled egg, and jello. He then began experiencing N/V and increased abdominal pain so returned to the ED.   Wife asks about long-term nutrition plan and if patient will need to remain hospitalized during healing. RD expressed that surgery-related plan is unknown to RD and therefore long-term plan is unable to be predicted at this time. Expressed that long-term TPN could be an option and that going home with a PICC on TPN is an option, if needed. Also let them know that a J-tube could potentially be an option, but re-iterated several times that RD does not know if this will be an option for this patient and that all plans and decisions are deferred to Surgeon concerning possible nutrition options as plans concerning surgery are per Surgeon.   Weight on 1/1 was documented as 140 lb. Wife shares patient was weighed in the bed with all blankets and pillows on the bed. He reports UBW is 176 lb and that he has lost 30 lb since surgery in 11/2020.  Weight on 07/24/20 was 171 lb which indicates 31 lb weight loss (18% body weight) in the past 1 year.   Labs reviewed; CBGs: 128 and 180 mg/dl, Ca: 8.3 mg/dl, triglycerides: 47 mg/dl. K, Mg, and Phos WDL this AM.   Medications reviewed; sliding scale novolog, 40 mg IV protonix/day.  IVF; D5-NS-20 mEq IV KCl @ 60 ml/hr (245 kcal/24 hrs).    NUTRITION - FOCUSED PHYSICAL EXAM:  Flowsheet Row Most Recent Value  Orbital Region Mild depletion  Upper Arm Region Moderate depletion  Thoracic and Lumbar Region Unable to assess  Buccal Region Mild depletion  Temple Region Mild depletion  Clavicle Bone Region Moderate depletion  Clavicle and Acromion Bone Region Moderate depletion  Scapular Bone Region Mild depletion  Dorsal Hand No depletion  Patellar Region Moderate depletion  Anterior Thigh Region Moderate depletion  Posterior Calf Region Moderate depletion  Edema (RD Assessment) None  Hair  Reviewed  Eyes Reviewed  Mouth Reviewed  Skin Reviewed  Nails Reviewed       Diet Order:   Diet Order             Diet NPO time specified  Diet effective now                   EDUCATION NEEDS:   Education needs have been addressed  Skin:  Skin Assessment: Reviewed RN Assessment  Last BM:  PTA/unknown  Height:   Ht Readings from Last 1 Encounters:  07/29/21 5\' 4"  (1.626 m)    Weight:   Wt Readings from Last 1 Encounters:  07/29/21 63.6 kg     Estimated Nutritional Needs:  Kcal:  2100-2300 kcal Protein:  105-120 grams Fluid:  >/= 2 L/day     09/26/21, MS, RD, LDN Inpatient Clinical Dietitian RD pager # available in [facility]  After hours/weekend pager # available in [facility]

## 2021-08-01 NOTE — Progress Notes (Addendum)
PHARMACY - TOTAL PARENTERAL NUTRITION CONSULT NOTE   Indication: gastric volvulus  Patient Measurements: Height: 5\' 4"  (162.6 cm) Weight: 63.6 kg (140 lb 3.4 oz) IBW/kg (Calculated) : 59.2 TPN AdjBW (KG): 63.6 Body mass index is 24.07 kg/m. Usual Weight:   Assessment: 51 yo M Acute abdominal pain and chest pain 07/29/21.  On 1/3: localized to lower chest pain, with persistent nausea and vomiting 6 days following redo take down of an incarcerated hiatal hernia with gastropexy. CT  1/1 shows a large hiatal hernia and findings consistent with a gastric volvulus.  1/2 urgent EGD: gastric lumen fully decompressed & NGT placed Pharmacy consulted to manage TPN to provide nutrition in setting of gastric volvulus.   Glucose / Insulin: no hx DM, CBGs 128 & 180 after TPN started at 40 ml/hr; serum glucose 104, 3 U SSI/24 hrs Electrolytes: WNL, Ca 8.3, CoCa 9.02 Renal: SCr 1.04 Hepatic:  LFTS WNL 1/4; Trig 47 (1/4)  Intake / Output; MIVF: D5NS 20 K @ 60 ml/hr I/O + 1573, NGO 400/24; UOP 750/24 hrs GI Imaging: CT 1/1 shows a large hiatal hernia and findings consistent with a gastric volvulus GI Surgeries / Procedures:  12/27  Takedown Nissen, reduction of incarcerated stomach in the chest with partial anterior diaphragmatic closure and gastropexy  1/2 : 1/2 urgent EGD: gastric lumen fulled decompressed & NGT placed, unable to reduce the gastric volvulus  Central access: PICC placed 1/3 for TPN TPN start date: 1/3  Nutritional Goals: Goal TPN rate is 80 mL/hr (provides 96 g of protein and 1842 kcals per day)  RD Assessment:  will f/u   Current Nutrition:  NPO  Plan:  Increase TPN to 80 mL/hr at 1800 and assess tolerance Electrolytes in TPN: Na 51mEq/L, K 63mEq/L, Ca 12mEq/L, Mg 59mEq/L, and Phos 71mmol/L. Cl:Ac 1:1 Add standard MVI and trace elements to TPN Continue Sensitive q6h SSI and adjust as needed  Reduce MIVF to 20 mL/hr at 1800 to keep total IVF 100 ml/hr or per CCS (will change  D5NS 20 K to NS @ 20 ml/hr)  Monitor TPN labs on Mon/Thurs F/u with RD for goals  12m, Pharm.D 08/01/2021 7:13 AM

## 2021-08-01 NOTE — Progress Notes (Signed)
Patient ID: Raymond Watson, male   DOB: June 22, 1971, 51 y.o.   MRN: [id number] [facility] Surgery Progress Note:   2 Days Post-Op  Subjective: Mental status is clear.  Complaints he had a good night.  Pain controlled. Objective: Vital signs in last 24 hours: Temp:  [97.7 F (36.5 C)-98.8 F (37.1 C)] 98.7 F (37.1 C) (01/04 0449) Pulse Rate:  [97-120] 109 (01/04 0449) Resp:  [18-30] 18 (01/04 0449) BP: (99-136)/(76-94) 120/94 (01/04 0449) SpO2:  [89 %-99 %] 96 % (01/04 0748)  Intake/Output from previous day: 01/03 0701 - 01/04 0700 In: 2723 [I.V.:2473; NG/GT:250] Out: 1150 [Urine:750; Emesis/NG output:400] Intake/Output this shift: No intake/output data recorded.  Physical Exam: Work of breathing is not labored.  Incisions OK.  NG draining  Lab Results:  Results for orders placed or performed during the hospital encounter of 07/29/21 (from the past 48 hour(s))  Basic metabolic panel     Status: Abnormal   Collection Time: 07/31/21  8:45 AM  Result Value Ref Range   Sodium 139 135 - 145 mmol/L   Potassium 4.5 3.5 - 5.1 mmol/L   Chloride 107 98 - 111 mmol/L   CO2 26 22 - 32 mmol/L   Glucose, Bld 106 (H) 70 - 99 mg/dL    Comment: Glucose reference range applies only to samples taken after fasting for at least 8 hours.   BUN 15 6 - 20 mg/dL   Creatinine, Ser 0.38 0.61 - 1.24 mg/dL   Calcium 8.4 (L) 8.9 - 10.3 mg/dL   GFR, Estimated >88 >28 mL/min    Comment: (NOTE) Calculated using the CKD-EPI Creatinine Equation (2021)    Anion gap 6 5 - 15    Comment: Performed at [facility], [address], [place], [place] [address]  CBC with Differential/Platelet     Status: Abnormal   Collection Time: 07/31/21  8:45 AM  Result Value Ref Range   WBC 8.9 4.0 - 10.5 K/uL   RBC 4.39 4.22 - 5.81 MIL/uL   Hemoglobin 11.7 (L) 13.0 - 17.0 g/dL   HCT 17.9 (L) 15.0 - 56.9 %   MCV 83.8 80.0 - 100.0 fL   MCH 26.7 26.0 - 34.0 pg   MCHC 31.8 30.0 - 36.0 g/dL   RDW 79.4  (H) 80.1 - 15.5 %   Platelets 154 150 - 400 K/uL   nRBC 0.0 0.0 - 0.2 %   Neutrophils Relative % 71 %   Neutro Abs 6.3 1.7 - 7.7 K/uL   Lymphocytes Relative 17 %   Lymphs Abs 1.5 0.7 - 4.0 K/uL   Monocytes Relative 11 %   Monocytes Absolute 1.0 0.1 - 1.0 K/uL   Eosinophils Relative 1 %   Eosinophils Absolute 0.1 0.0 - 0.5 K/uL   Basophils Relative 0 %   Basophils Absolute 0.0 0.0 - 0.1 K/uL   Immature Granulocytes 0 %   Abs Immature Granulocytes 0.03 0.00 - 0.07 K/uL    Comment: Performed at [facility], [address] [place], [place] [address]  Glucose, capillary     Status: Abnormal   Collection Time: 08/01/21 12:16 AM  Result Value Ref Range   Glucose-Capillary 128 (H) 70 - 99 mg/dL    Comment: Glucose reference range applies only to samples taken after fasting for at least 8 hours.  Comprehensive metabolic panel     Status: Abnormal   Collection Time: 08/01/21  4:03 AM  Result Value Ref Range   Sodium 137 135 -  145 mmol/L   Potassium 4.0 3.5 - 5.1 mmol/L   Chloride 105 98 - 111 mmol/L   CO2 26 22 - 32 mmol/L   Glucose, Bld 104 (H) 70 - 99 mg/dL    Comment: Glucose reference range applies only to samples taken after fasting for at least 8 hours.   BUN 16 6 - 20 mg/dL   Creatinine, Ser 4.00 0.61 - 1.24 mg/dL   Calcium 8.3 (L) 8.9 - 10.3 mg/dL   Total Protein 6.0 (L) 6.5 - 8.1 g/dL   Albumin 3.1 (L) 3.5 - 5.0 g/dL   AST 12 (L) 15 - 41 U/L   ALT 16 0 - 44 U/L   Alkaline Phosphatase 55 38 - 126 U/L   Total Bilirubin 1.1 0.3 - 1.2 mg/dL   GFR, Estimated >86 >76 mL/min    Comment: (NOTE) Calculated using the CKD-EPI Creatinine Equation (2021)    Anion gap 6 5 - 15    Comment: Performed at [facility], [address], [place], [place] [address]  Magnesium     Status: None   Collection Time: 08/01/21  4:03 AM  Result Value Ref Range   Magnesium 2.2 1.7 - 2.4 mg/dL    Comment: Performed at [facility], [address], [place], [place] [address]  Phosphorus     Status: None   Collection Time: 08/01/21  4:03 AM  Result Value Ref Range   Phosphorus 3.3 2.5 - 4.6 mg/dL    Comment: Performed at [facility], [address], [place], [place] [address]  Triglycerides     Status: None   Collection Time: 08/01/21  4:03 AM  Result Value Ref Range   Triglycerides 47 <150 mg/dL    Comment: Performed at [facility], [address] [place], [place] [address]  CBC with Differential/Platelet     Status: Abnormal   Collection Time: 08/01/21  4:03 AM  Result Value Ref Range   WBC 7.7 4.0 - 10.5 K/uL   RBC 4.12 (L) 4.22 - 5.81 MIL/uL   Hemoglobin 11.0 (L) 13.0 - 17.0 g/dL   HCT 82.5 (L) 05.3 - 97.6 %   MCV 83.5 80.0 - 100.0 fL   MCH 26.7 26.0 - 34.0 pg   MCHC 32.0 30.0 - 36.0 g/dL   RDW 73.4 19.3 - 79.0 %   Platelets 138 (L) 150 - 400 K/uL   nRBC 0.0 0.0 - 0.2 %   Neutrophils Relative % 65 %   Neutro Abs 5.0 1.7 - 7.7 K/uL   Lymphocytes Relative 19 %   Lymphs Abs 1.5 0.7 - 4.0 K/uL   Monocytes Relative 15 %   Monocytes Absolute 1.1 (H) 0.1 - 1.0 K/uL   Eosinophils Relative 0 %   Eosinophils Absolute 0.0 0.0 - 0.5 K/uL   Basophils Relative 0 %   Basophils Absolute 0.0 0.0 - 0.1 K/uL   Immature Granulocytes 1 %   Abs Immature Granulocytes 0.04 0.00 - 0.07 K/uL    Comment: Performed at [facility], [address], [place], [place] [address]  Glucose, capillary     Status: Abnormal   Collection Time: 08/01/21  4:51 AM  Result Value Ref Range   Glucose-Capillary 180 (H) 70 - 99 mg/dL    Comment: Glucose reference range applies only to samples taken after fasting for at least 8 hours.    Radiology/Results: [place] EKG [facility]  Result Date: 07/31/2021 If [facility]  not attached, placement could not be confirmed due to current cardiac rhythm.   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  I [facility] about suggests for management of his complication.  This info was shared with his wife.   2 Days Post-Op    LOS: 3 days   Matt B. [name] DeutscherMartin, MD, [facility]  [facility] [phone] to reach the surgeon on call.    08/01/2021 9:38 AM

## 2021-08-02 DIAGNOSIS — E440 Moderate protein-calorie malnutrition: Secondary | ICD-10-CM | POA: Insufficient documentation

## 2021-08-02 LAB — COMPREHENSIVE METABOLIC PANEL
ALT: 28 U/L (ref 0–44)
AST: 25 U/L (ref 15–41)
Albumin: 2.9 g/dL — ABNORMAL LOW (ref 3.5–5.0)
Alkaline Phosphatase: 55 U/L (ref 38–126)
Anion gap: 7 (ref 5–15)
BUN: 16 mg/dL (ref 6–20)
CO2: 25 mmol/L (ref 22–32)
Calcium: 8.1 mg/dL — ABNORMAL LOW (ref 8.9–10.3)
Chloride: 103 mmol/L (ref 98–111)
Creatinine, Ser: 1.07 mg/dL (ref 0.61–1.24)
GFR, Estimated: >60 mL/min (ref >60)
Glucose, Bld: 110 mg/dL — ABNORMAL HIGH (ref 70–99)
Potassium: 3.6 mmol/L (ref 3.5–5.1)
Sodium: 135 mmol/L (ref 135–145)
Total Bilirubin: 0.8 mg/dL (ref 0.3–1.2)
Total Protein: 5.9 g/dL — ABNORMAL LOW (ref 6.5–8.1)

## 2021-08-02 LAB — GLUCOSE, CAPILLARY
Glucose-Capillary: 102 mg/dL — ABNORMAL HIGH (ref 70–99)
Glucose-Capillary: 113 mg/dL — ABNORMAL HIGH (ref 70–99)
Glucose-Capillary: 115 mg/dL — ABNORMAL HIGH (ref 70–99)
Glucose-Capillary: 117 mg/dL — ABNORMAL HIGH (ref 70–99)
Glucose-Capillary: 123 mg/dL — ABNORMAL HIGH (ref 70–99)

## 2021-08-02 LAB — MAGNESIUM: Magnesium: 2.3 mg/dL (ref 1.7–2.4)

## 2021-08-02 LAB — PHOSPHORUS: Phosphorus: 3.9 mg/dL (ref 2.5–4.6)

## 2021-08-02 MED ORDER — KCL IN DEXTROSE-NACL 20-5-0.9 MEQ/L-%-% IV SOLN
INTRAVENOUS | Status: AC
  Filled 2021-08-02: qty 1000

## 2021-08-02 MED ORDER — TRAVASOL 10 % IV SOLN
INTRAVENOUS | Status: AC
  Filled 2021-08-02: qty 1122

## 2021-08-02 NOTE — TOC Progression Note (Signed)
Transition of Care [facility]) - Progression Note    Patient Details  Name: Raymond Watson MRN: [id number] Date of Birth: Aug 18, 1970  Transition of Care [facility]) CM/SW Contact  Irwin Toran, Olegario Messier, RN Phone Number: 08/02/2021, 2:53 PM  Clinical Narrative: Continue to monitor for d/c needs.Noted-TPN continuous. NGT-suction.        Barriers to Discharge: Continued Medical Work up  Expected Discharge Plan and Services                                                 Social Determinants of Health (SDOH) Interventions    Readmission Risk Interventions No flowsheet data found.

## 2021-08-02 NOTE — Progress Notes (Signed)
PHARMACY - TOTAL PARENTERAL NUTRITION CONSULT NOTE   Indication: gastric volvulus  Patient Measurements: Height: 5\' 4"  (162.6 cm) Weight: 63.6 kg (140 lb 3.4 oz) IBW/kg (Calculated) : 59.2 TPN AdjBW (KG): 63.6 Body mass index is 24.07 kg/m. Usual Weight:   Assessment: 51 yo M Acute abdominal pain and chest pain 07/29/21.  On 1/3: localized to lower chest pain, with persistent nausea and vomiting 6 days following redo take down of an incarcerated hiatal hernia with gastropexy. CT  1/1 shows a large hiatal hernia and findings consistent with a gastric volvulus.  1/2 urgent EGD: gastric lumen fully decompressed & NGT placed Pharmacy consulted to manage TPN to provide nutrition in setting of gastric volvulus.   Glucose / Insulin: no hx DM, CBGs <130; 2 units SSI used in the past 24h; on D10 @ 62mL/hr until 1759 1/5 Electrolytes: WNL, CoCa 9 Renal: SCr 1.07, BUN 25 - stable Hepatic:  LFTS WNL 1/5; Trig 47 (1/4)  Intake / Output; MIVF: D10 @ 80 mL/hr I/O + 1159, NGO 200 mL yesterday, 400 mL thus far this AM; UOP 720/24 hrs GI Imaging: CT 1/1 shows a large hiatal hernia and findings consistent with a gastric volvulus GI Surgeries / Procedures:  12/27  Takedown Nissen, reduction of incarcerated stomach in the chest with partial anterior diaphragmatic closure and gastropexy  1/2 : 1/2 urgent EGD: gastric lumen fulled decompressed & NGT placed, unable to reduce the gastric volvulus  Central access: PICC placed 1/3 for TPN TPN start date: 1/3  Nutritional Goals: Goal TPN rate is 85 mL/hr (provides 112 g of protein and 2101 kcals per day)  RD Assessment:Estimated Needs Total Energy Estimated Needs: 2100-2300 kcal Total Protein Estimated Needs: 105-120 grams Total Fluid Estimated Needs: >/= 2 L/day  Current Nutrition:  NPO TPN was not hung 1/4 PM due to incorrect tubing attached to bag - D10 @ 63mL/hr currently running until 1/5 @ 1759  Plan:  Begin TPN at goal rate (85 mL/hr) at 1800 and  assess tolerance Electrolytes in TPN: Na 47mEq/L, K 58mEq/L, Ca 7mEq/L, Mg 80mEq/L, and Phos 33mmol/L. Cl:Ac 1:1 Add standard MVI and trace elements to TPN Continue Sensitive q6h SSI and adjust as needed  Discontinue D10 @ 1800 this evening Resume previous MIVF (D5NS + Kcl 20) at 15 mL/hr @ 1800 (or per CCS) Monitor TPN labs on Mon/Thurs   4m, PharmD 08/02/2021 7:28 AM

## 2021-08-03 LAB — GLUCOSE, CAPILLARY
Glucose-Capillary: 104 mg/dL — ABNORMAL HIGH (ref 70–99)
Glucose-Capillary: 105 mg/dL — ABNORMAL HIGH (ref 70–99)
Glucose-Capillary: 102 mg/dL — ABNORMAL HIGH (ref 70–99)

## 2021-08-03 LAB — BASIC METABOLIC PANEL
Anion gap: 6 (ref 5–15)
BUN: 19 mg/dL (ref 6–20)
CO2: 27 mmol/L (ref 22–32)
Calcium: 8.4 mg/dL — ABNORMAL LOW (ref 8.9–10.3)
Chloride: 103 mmol/L (ref 98–111)
Creatinine, Ser: 1.06 mg/dL (ref 0.61–1.24)
GFR, Estimated: >60 mL/min (ref >60)
Glucose, Bld: 104 mg/dL — ABNORMAL HIGH (ref 70–99)
Potassium: 3.9 mmol/L (ref 3.5–5.1)
Sodium: 136 mmol/L (ref 135–145)

## 2021-08-03 LAB — CULTURE, BLOOD (ROUTINE X 2): Culture: NO GROWTH

## 2021-08-03 LAB — PHOSPHORUS: Phosphorus: 4.1 mg/dL (ref 2.5–4.6)

## 2021-08-03 LAB — MAGNESIUM: Magnesium: 2.3 mg/dL (ref 1.7–2.4)

## 2021-08-03 MED ORDER — KCL IN DEXTROSE-NACL 20-5-0.9 MEQ/L-%-% IV SOLN
INTRAVENOUS | Status: DC
  Filled 2021-08-03 (×2): qty 1000

## 2021-08-03 MED ORDER — BISACODYL 10 MG RE SUPP
10.0000 mg | Freq: Once | RECTAL | Status: AC
  Administered 2021-08-03: 10 mg via RECTAL
  Filled 2021-08-03: qty 1

## 2021-08-03 MED ORDER — TRAVASOL 10 % IV SOLN
INTRAVENOUS | Status: AC
  Filled 2021-08-03: qty 1122

## 2021-08-03 NOTE — Progress Notes (Signed)
PHARMACY - TOTAL PARENTERAL NUTRITION CONSULT NOTE   Indication: gastric volvulus  Patient Measurements: Height: 5\' 4"  (162.6 cm) Weight: 63.6 kg (140 lb 3.4 oz) IBW/kg (Calculated) : 59.2 TPN AdjBW (KG): 63.6 Body mass index is 24.07 kg/m. Usual Weight:   Assessment: 51 yo M Acute abdominal pain and chest pain 07/29/21.  On 1/3: localized to lower chest pain, with persistent nausea and vomiting 6 days following redo take down of an incarcerated hiatal hernia with gastropexy. CT  1/1 shows a large hiatal hernia and findings consistent with a gastric volvulus.  1/2 urgent EGD: gastric lumen fully decompressed & NGT placed Pharmacy consulted to manage TPN to provide nutrition in setting of gastric volvulus.   Glucose / Insulin: no hx DM, CBGs <120; 0 units SSI used in the past 24h Electrolytes: WNL, CoCa 9.3 Renal: SCr 1.06, BUN 19 - stable Hepatic:  LFTS WNL 1/5; Trig 47 (1/4)  Intake / Output; MIVF:  I/O + 140, NGO 800 mL yesterday; UOP 1175 mL/ 24 hours GI Imaging: CT 1/1 shows a large hiatal hernia and findings consistent with a gastric volvulus GI Surgeries / Procedures:  12/27  Takedown Nissen, reduction of incarcerated stomach in the chest with partial anterior diaphragmatic closure and gastropexy  1/2 : 1/2 urgent EGD: gastric lumen fulled decompressed & NGT placed, unable to reduce the gastric volvulus  Central access: PICC placed 1/3 for TPN TPN start date: 1/3  Nutritional Goals: Goal TPN rate is 85 mL/hr (provides 112 g of protein and 2101 kcals per day)  RD Assessment:Estimated Needs Total Energy Estimated Needs: 2100-2300 kcal Total Protein Estimated Needs: 105-120 grams Total Fluid Estimated Needs: >/= 2 L/day  Current Nutrition:  NPO TPN   Plan:  Continue TPN at goal rate (85 mL/hr) at 1800 and assess tolerance Electrolytes in TPN: Na 79mEq/L, K 56mEq/L, Ca 59mEq/L, Mg 75mEq/L, and Phos 65mmol/L. Cl:Ac 1:1 Add standard MVI and trace elements to TPN Continue  Sensitive q6h SSI and adjust as needed  Continue MIVF (D5NS + Kcl 20) at 15 mL/hr (or per CCS) Check BMET, Mg, and Phos on 1/7 and 1/8 Monitor TPN labs on Mon/Thurs   3/8, PharmD 08/03/2021 7:13 AM

## 2021-08-03 NOTE — Progress Notes (Signed)
Patient ID: Raymond Watson, male   DOB: 09/23/1970, 51 y.o.   MRN: [id number] [facility] Progress Note:   4 Days Post-Op  Subjective: Mental status is clear.  Complaints last night the tube was clogged and he had pressure noted. Objective: Vital signs in last 24 hours: Temp:  [98.6 F (37 C)-99.7 F (37.6 C)] 98.6 F (37 C) (01/06 0547) Pulse Rate:  [70-80] 70 (01/06 0547) Resp:  [16-18] 16 (01/06 0547) BP: (126-134)/(86-99) 126/86 (01/06 0547) SpO2:  [98 %-99 %] 99 % (01/06 0547)  Intake/Output from previous day: 01/05 0701 - 01/06 0700 In: 2115.1 [I.V.:2115.1] Out: 1275 [Urine:875; Emesis/NG output:400] Intake/Output this shift: No intake/output data recorded.  Physical Exam: Work of breathing is normal.    Lab Results:  Results for orders placed or performed during the hospital encounter of 07/29/21 (from the past 48 hour(s))  Glucose, capillary     Status: Abnormal   Collection Time: 08/01/21 11:40 AM  Result Value Ref Range   Glucose-Capillary 128 (H) 70 - 99 mg/dL    Comment: Glucose reference range applies only to samples taken after fasting for at least 8 hours.  Glucose, capillary     Status: Abnormal   Collection Time: 08/01/21  5:29 PM  Result Value Ref Range   Glucose-Capillary 110 (H) 70 - 99 mg/dL    Comment: Glucose reference range applies only to samples taken after fasting for at least 8 hours.  Glucose, capillary     Status: Abnormal   Collection Time: 08/02/21 12:17 AM  Result Value Ref Range   Glucose-Capillary 123 (H) 70 - 99 mg/dL    Comment: Glucose reference range applies only to samples taken after fasting for at least 8 hours.  Comprehensive metabolic panel     Status: Abnormal   Collection Time: 08/02/21  3:56 AM  Result Value Ref Range   Sodium 135 135 - 145 mmol/L   Potassium 3.6 3.5 - 5.1 mmol/L   Chloride 103 98 - 111 mmol/L   CO2 25 22 - 32 mmol/L   Glucose, Bld 110 (H) 70 - 99 mg/dL    Comment: Glucose reference range applies  only to samples taken after fasting for at least 8 hours.   BUN 16 6 - 20 mg/dL   Creatinine, Ser 4.091.07 0.61 - 1.24 mg/dL   Calcium 8.1 (L) 8.9 - 10.3 mg/dL   Total Protein 5.9 (L) 6.5 - 8.1 g/dL   Albumin 2.9 (L) 3.5 - 5.0 g/dL   AST 25 15 - 41 U/L   ALT 28 0 - 44 U/L   Alkaline Phosphatase 55 38 - 126 U/L   Total Bilirubin 0.8 0.3 - 1.2 mg/dL   GFR, Estimated >81>60 >19>60 mL/min    Comment: (NOTE) Calculated using the CKD-EPI Creatinine Equation (2021)    Anion gap 7 5 - 15    Comment: Performed at [facility], [address] [place], [place] [address]  Magnesium     Status: None   Collection Time: 08/02/21  3:56 AM  Result Value Ref Range   Magnesium 2.3 1.7 - 2.4 mg/dL    Comment: Performed at [facility], [address] [place], [place] [address]  Phosphorus     Status: None   Collection Time: 08/02/21  3:56 AM  Result Value Ref Range   Phosphorus 3.9 2.5 - 4.6 mg/dL    Comment: Performed at [facility], [address] [place], [place] [address]  Glucose,  capillary     Status: Abnormal   Collection Time: 08/02/21  5:22 AM  Result Value Ref Range   Glucose-Capillary 113 (H) 70 - 99 mg/dL    Comment: Glucose reference range applies only to samples taken after fasting for at least 8 hours.  Glucose, capillary     Status: Abnormal   Collection Time: 08/02/21 12:01 PM  Result Value Ref Range   Glucose-Capillary 117 (H) 70 - 99 mg/dL    Comment: Glucose reference range applies only to samples taken after fasting for at least 8 hours.  Glucose, capillary     Status: Abnormal   Collection Time: 08/02/21  4:57 PM  Result Value Ref Range   Glucose-Capillary 102 (H) 70 - 99 mg/dL    Comment: Glucose reference range applies only to samples taken after fasting for at least 8 hours.  Glucose, capillary     Status: Abnormal   Collection Time: 08/02/21 11:57 PM  Result Value Ref Range   Glucose-Capillary 115 (H) 70 - 99 mg/dL     Comment: Glucose reference range applies only to samples taken after fasting for at least 8 hours.  Basic metabolic panel     Status: Abnormal   Collection Time: 08/03/21  3:28 AM  Result Value Ref Range   Sodium 136 135 - 145 mmol/L   Potassium 3.9 3.5 - 5.1 mmol/L   Chloride 103 98 - 111 mmol/L   CO2 27 22 - 32 mmol/L   Glucose, Bld 104 (H) 70 - 99 mg/dL    Comment: Glucose reference range applies only to samples taken after fasting for at least 8 hours.   BUN 19 6 - 20 mg/dL   Creatinine, Ser 2.70 0.61 - 1.24 mg/dL   Calcium 8.4 (L) 8.9 - 10.3 mg/dL   GFR, Estimated >62 >37 mL/min    Comment: (NOTE) Calculated using the CKD-EPI Creatinine Equation (2021)    Anion gap 6 5 - 15    Comment: Performed at [facility], [address], [place], [place] [address]  Magnesium     Status: None   Collection Time: 08/03/21  3:28 AM  Result Value Ref Range   Magnesium 2.3 1.7 - 2.4 mg/dL    Comment: Performed at [facility], [address] [place], [place] [address]  Phosphorus     Status: None   Collection Time: 08/03/21  3:28 AM  Result Value Ref Range   Phosphorus 4.1 2.5 - 4.6 mg/dL    Comment: Performed at [facility], [address] [place], [place] [address]  Glucose, capillary     Status: Abnormal   Collection Time: 08/03/21  5:48 AM  Result Value Ref Range   Glucose-Capillary 104 (H) 70 - 99 mg/dL    Comment: Glucose reference range applies only to samples taken after fasting for at least 8 hours.    Radiology/Results: No results found.  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  NG dependent to reduce discomfort.   4 Days Post-Op    LOS: 5 days   Matt B. [name], MD, [facility] [phone] to reach the surgeon on call.    08/03/2021 7:51 AM

## 2021-08-03 NOTE — Progress Notes (Signed)
Patient ID: Raymond Watson, male   DOB: 09-01-1970, 51 y.o.   MRN: [id number] [facility] Progress Note:   4 Days Post-Op  Subjective: Mental status is clear.  Complaints good night sleeping. Objective: Vital signs in last 24 hours: Temp:  [98.6 F (37 C)-99.7 F (37.6 C)] 98.6 F (37 C) (01/06 0547) Pulse Rate:  [70-80] 70 (01/06 0547) Resp:  [16-18] 16 (01/06 0547) BP: (126-134)/(86-99) 126/86 (01/06 0547) SpO2:  [98 %-99 %] 99 % (01/06 0547)  Intake/Output from previous day: 01/05 0701 - 01/06 0700 In: 2115.1 [I.V.:2115.1] Out: 1275 [Urine:875; Emesis/NG output:400] Intake/Output this shift: No intake/output data recorded.  Physical Exam: Work of breathing is normal.  No complaints  Lab Results:  Results for orders placed or performed during the hospital encounter of 07/29/21 (from the past 48 hour(s))  Glucose, capillary     Status: Abnormal   Collection Time: 08/01/21 11:40 AM  Result Value Ref Range   Glucose-Capillary 128 (H) 70 - 99 mg/dL    Comment: Glucose reference range applies only to samples taken after fasting for at least 8 hours.  Glucose, capillary     Status: Abnormal   Collection Time: 08/01/21  5:29 PM  Result Value Ref Range   Glucose-Capillary 110 (H) 70 - 99 mg/dL    Comment: Glucose reference range applies only to samples taken after fasting for at least 8 hours.  Glucose, capillary     Status: Abnormal   Collection Time: 08/02/21 12:17 AM  Result Value Ref Range   Glucose-Capillary 123 (H) 70 - 99 mg/dL    Comment: Glucose reference range applies only to samples taken after fasting for at least 8 hours.  Comprehensive metabolic panel     Status: Abnormal   Collection Time: 08/02/21  3:56 AM  Result Value Ref Range   Sodium 135 135 - 145 mmol/L   Potassium 3.6 3.5 - 5.1 mmol/L   Chloride 103 98 - 111 mmol/L   CO2 25 22 - 32 mmol/L   Glucose, Bld 110 (H) 70 - 99 mg/dL    Comment: Glucose reference range applies only to samples taken  after fasting for at least 8 hours.   BUN 16 6 - 20 mg/dL   Creatinine, Ser 5.63 0.61 - 1.24 mg/dL   Calcium 8.1 (L) 8.9 - 10.3 mg/dL   Total Protein 5.9 (L) 6.5 - 8.1 g/dL   Albumin 2.9 (L) 3.5 - 5.0 g/dL   AST 25 15 - 41 U/L   ALT 28 0 - 44 U/L   Alkaline Phosphatase 55 38 - 126 U/L   Total Bilirubin 0.8 0.3 - 1.2 mg/dL   GFR, Estimated >14 >97 mL/min    Comment: (NOTE) Calculated using the CKD-EPI Creatinine Equation (2021)    Anion gap 7 5 - 15    Comment: Performed at [facility], [address], [place], [place] [address]  Magnesium     Status: None   Collection Time: 08/02/21  3:56 AM  Result Value Ref Range   Magnesium 2.3 1.7 - 2.4 mg/dL    Comment: Performed at [facility], [address], [place], [place] [address]  Phosphorus     Status: None   Collection Time: 08/02/21  3:56 AM  Result Value Ref Range   Phosphorus 3.9 2.5 - 4.6 mg/dL    Comment: Performed at [facility], [address], [place], [place] [address]  Glucose, capillary     Status: Abnormal  Collection Time: 08/02/21  5:22 AM  Result Value Ref Range   Glucose-Capillary 113 (H) 70 - 99 mg/dL    Comment: Glucose reference range applies only to samples taken after fasting for at least 8 hours.  Glucose, capillary     Status: Abnormal   Collection Time: 08/02/21 12:01 PM  Result Value Ref Range   Glucose-Capillary 117 (H) 70 - 99 mg/dL    Comment: Glucose reference range applies only to samples taken after fasting for at least 8 hours.  Glucose, capillary     Status: Abnormal   Collection Time: 08/02/21  4:57 PM  Result Value Ref Range   Glucose-Capillary 102 (H) 70 - 99 mg/dL    Comment: Glucose reference range applies only to samples taken after fasting for at least 8 hours.  Glucose, capillary     Status: Abnormal   Collection Time: 08/02/21 11:57 PM  Result Value Ref Range   Glucose-Capillary 115 (H) 70 - 99 mg/dL    Comment: Glucose  reference range applies only to samples taken after fasting for at least 8 hours.  Basic metabolic panel     Status: Abnormal   Collection Time: 08/03/21  3:28 AM  Result Value Ref Range   Sodium 136 135 - 145 mmol/L   Potassium 3.9 3.5 - 5.1 mmol/L   Chloride 103 98 - 111 mmol/L   CO2 27 22 - 32 mmol/L   Glucose, Bld 104 (H) 70 - 99 mg/dL    Comment: Glucose reference range applies only to samples taken after fasting for at least 8 hours.   BUN 19 6 - 20 mg/dL   Creatinine, Ser 5.631.06 0.61 - 1.24 mg/dL   Calcium 8.4 (L) 8.9 - 10.3 mg/dL   GFR, Estimated >87>60 >56>60 mL/min    Comment: (NOTE) Calculated using the CKD-EPI Creatinine Equation (2021)    Anion gap 6 5 - 15    Comment: Performed at [facility], [address] [place], [place] [address]  Magnesium     Status: None   Collection Time: 08/03/21  3:28 AM  Result Value Ref Range   Magnesium 2.3 1.7 - 2.4 mg/dL    Comment: Performed at [facility], [address] [place], [place] [address]  Phosphorus     Status: None   Collection Time: 08/03/21  3:28 AM  Result Value Ref Range   Phosphorus 4.1 2.5 - 4.6 mg/dL    Comment: Performed at [facility], [address] [place], [place] [address]  Glucose, capillary     Status: Abnormal   Collection Time: 08/03/21  5:48 AM  Result Value Ref Range   Glucose-Capillary 104 (H) 70 - 99 mg/dL    Comment: Glucose reference range applies only to samples taken after fasting for at least 8 hours.    Radiology/Results: No results found.  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  TNA in progress 4 Days Post-Op    LOS: 5 days   Matt B. [name], MD, [facility] [phone] to reach the surgeon on call.    08/03/2021 7:49 AM

## 2021-08-04 LAB — CULTURE, BLOOD (ROUTINE X 2): Culture: NO GROWTH

## 2021-08-04 LAB — GLUCOSE, CAPILLARY
Glucose-Capillary: 123 mg/dL — ABNORMAL HIGH (ref 70–99)
Glucose-Capillary: 132 mg/dL — ABNORMAL HIGH (ref 70–99)
Glucose-Capillary: 115 mg/dL — ABNORMAL HIGH (ref 70–99)

## 2021-08-04 LAB — BASIC METABOLIC PANEL
Anion gap: 6 (ref 5–15)
BUN: 24 mg/dL — ABNORMAL HIGH (ref 6–20)
CO2: 26 mmol/L (ref 22–32)
Calcium: 8.5 mg/dL — ABNORMAL LOW (ref 8.9–10.3)
Chloride: 104 mmol/L (ref 98–111)
Creatinine, Ser: 1.07 mg/dL (ref 0.61–1.24)
GFR, Estimated: >60 mL/min (ref >60)
Glucose, Bld: 101 mg/dL — ABNORMAL HIGH (ref 70–99)
Potassium: 3.8 mmol/L (ref 3.5–5.1)
Sodium: 136 mmol/L (ref 135–145)

## 2021-08-04 LAB — CBC
HCT: 34.4 % — ABNORMAL LOW (ref 39.0–52.0)
Hemoglobin: 11.1 g/dL — ABNORMAL LOW (ref 13.0–17.0)
MCH: 27.1 pg (ref 26.0–34.0)
MCHC: 32.3 g/dL (ref 30.0–36.0)
MCV: 84.1 fL (ref 80.0–100.0)
Platelets: 190 10*3/uL (ref 150–400)
RBC: 4.09 MIL/uL — ABNORMAL LOW (ref 4.22–5.81)
RDW: 14.8 % (ref 11.5–15.5)
WBC: 6.8 10*3/uL (ref 4.0–10.5)
nRBC: 0 % (ref 0.0–0.2)

## 2021-08-04 LAB — MAGNESIUM: Magnesium: 2.2 mg/dL (ref 1.7–2.4)

## 2021-08-04 LAB — PHOSPHORUS: Phosphorus: 3.6 mg/dL (ref 2.5–4.6)

## 2021-08-04 MED ORDER — TRAVASOL 10 % IV SOLN
INTRAVENOUS | Status: AC
  Filled 2021-08-04: qty 1122

## 2021-08-04 NOTE — Progress Notes (Signed)
Patient ID: Raymond Watson, male   DOB: [birth year], 51 y.o.   MRN: [id number] [facility] Progress Note:   5 Days Post-Op  Subjective: No new issues.  NGT working.  Had 2 BMs with suppository yesterday.  Mobilizing in his room.  Objective: Vital signs in last 24 hours: Temp:  [98 F (36.7 C)-99.3 F (37.4 C)] 98.9 F (37.2 C) (01/07 0515) Pulse Rate:  [68-87] 68 (01/07 0515) Resp:  [16-18] 16 (01/07 0515) BP: (130-141)/(84-98) 141/95 (01/07 0515) SpO2:  [97 %-100 %] 100 % (01/07 0515)  Intake/Output from previous day: 01/06 0701 - 01/07 0700 In: 2317.8 [I.V.:2317.8] Out: 1000 [Urine:650; Emesis/NG output:350] Intake/Output this shift: No intake/output data recorded.  Physical Exam:  Abd: soft, NT, ND, incisions are healing well.  NGT in place and working well.  Lab Results:  Results for orders placed or performed during the hospital encounter of 07/29/21 (from the past 48 hour(s))  Glucose, capillary     Status: Abnormal   Collection Time: 08/02/21 12:01 PM  Result Value Ref Range   Glucose-Capillary 117 (H) 70 - 99 mg/dL    Comment: Glucose reference range applies only to samples taken after fasting for at least 8 hours.  Glucose, capillary     Status: Abnormal   Collection Time: 08/02/21  4:57 PM  Result Value Ref Range   Glucose-Capillary 102 (H) 70 - 99 mg/dL    Comment: Glucose reference range applies only to samples taken after fasting for at least 8 hours.  Glucose, capillary     Status: Abnormal   Collection Time: 08/02/21 11:57 PM  Result Value Ref Range   Glucose-Capillary 115 (H) 70 - 99 mg/dL    Comment: Glucose reference range applies only to samples taken after fasting for at least 8 hours.  Basic metabolic panel     Status: Abnormal   Collection Time: 08/03/21  3:28 AM  Result Value Ref Range   Sodium 136 135 - 145 mmol/L   Potassium 3.9 3.5 - 5.1 mmol/L   Chloride 103 98 - 111 mmol/L   CO2 27 22 - 32 mmol/L   Glucose, Bld 104 (H) 70 - 99 mg/dL     Comment: Glucose reference range applies only to samples taken after fasting for at least 8 hours.   BUN 19 6 - 20 mg/dL   Creatinine, Ser 3.21 0.61 - 1.24 mg/dL   Calcium 8.4 (L) 8.9 - 10.3 mg/dL   GFR, Estimated >22 >48 mL/min    Comment: (NOTE) Calculated using the CKD-EPI Creatinine Equation (2021)    Anion gap 6 5 - 15    Comment: Performed at [facility], [address] [place], [place] [address]  Magnesium     Status: None   Collection Time: 08/03/21  3:28 AM  Result Value Ref Range   Magnesium 2.3 1.7 - 2.4 mg/dL    Comment: Performed at [facility], [address], [place], [place] [address]  Phosphorus     Status: None   Collection Time: 08/03/21  3:28 AM  Result Value Ref Range   Phosphorus 4.1 2.5 - 4.6 mg/dL    Comment: Performed at [facility], [address], [place], [place] [address]  Glucose, capillary     Status: Abnormal   Collection Time: 08/03/21  5:48 AM  Result Value Ref Range   Glucose-Capillary 104 (H) 70 - 99 mg/dL    Comment: Glucose reference range applies only to samples taken after  fasting for at least 8 hours.  Glucose, capillary     Status: Abnormal   Collection Time: 08/03/21 12:38 PM  Result Value Ref Range   Glucose-Capillary 105 (H) 70 - 99 mg/dL    Comment: Glucose reference range applies only to samples taken after fasting for at least 8 hours.  Glucose, capillary     Status: Abnormal   Collection Time: 08/03/21  5:25 PM  Result Value Ref Range   Glucose-Capillary 102 (H) 70 - 99 mg/dL    Comment: Glucose reference range applies only to samples taken after fasting for at least 8 hours.  Basic metabolic panel     Status: Abnormal   Collection Time: 08/04/21  2:10 AM  Result Value Ref Range   Sodium 136 135 - 145 mmol/L   Potassium 3.8 3.5 - 5.1 mmol/L   Chloride 104 98 - 111 mmol/L   CO2 26 22 - 32 mmol/L   Glucose, Bld 101 (H) 70 - 99 mg/dL    Comment: Glucose reference  range applies only to samples taken after fasting for at least 8 hours.   BUN 24 (H) 6 - 20 mg/dL   Creatinine, Ser 8.291.07 0.61 - 1.24 mg/dL   Calcium 8.5 (L) 8.9 - 10.3 mg/dL   GFR, Estimated >56>60 >21>60 mL/min    Comment: (NOTE) Calculated using the CKD-EPI Creatinine Equation (2021)    Anion gap 6 5 - 15    Comment: Performed at [facility], [address] [place], [place] [address]  Magnesium     Status: None   Collection Time: 08/04/21  2:10 AM  Result Value Ref Range   Magnesium 2.2 1.7 - 2.4 mg/dL    Comment: Performed at [facility], [address] [place], [place] [address]  Phosphorus     Status: None   Collection Time: 08/04/21  2:10 AM  Result Value Ref Range   Phosphorus 3.6 2.5 - 4.6 mg/dL    Comment: Performed at [facility], [address] [place], [place] [address]  CBC     Status: Abnormal   Collection Time: 08/04/21  2:10 AM  Result Value Ref Range   WBC 6.8 4.0 - 10.5 K/uL   RBC 4.09 (L) 4.22 - 5.81 MIL/uL   Hemoglobin 11.1 (L) 13.0 - 17.0 g/dL   HCT 84.134.4 (L) 32.439.0 - 40.152.0 %   MCV 84.1 80.0 - 100.0 fL   MCH 27.1 26.0 - 34.0 pg   MCHC 32.3 30.0 - 36.0 g/dL   RDW 02.714.8 25.311.5 - 66.415.5 %   Platelets 190 150 - 400 K/uL   nRBC 0.0 0.0 - 0.2 %    Comment: Performed at [facility], [address] [place], [place] [address]  Glucose, capillary     Status: Abnormal   Collection Time: 08/04/21  5:39 AM  Result Value Ref Range   Glucose-Capillary 123 (H) 70 - 99 mg/dL    Comment: Glucose reference range applies only to samples taken after fasting for at least 8 hours.    Radiology/Results: No results found.  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 [phone] to reach the surgeon on call.    08/04/2021 8:24 AM

## 2021-08-04 NOTE — Progress Notes (Signed)
PHARMACY - TOTAL PARENTERAL NUTRITION CONSULT NOTE   Indication: gastric volvulus  Patient Measurements: Height: 5\' 4"  (162.6 cm) Weight: 63.6 kg (140 lb 3.4 oz) IBW/kg (Calculated) : 59.2 TPN AdjBW (KG): 63.6 Body mass index is 24.07 kg/m. Usual Weight:   Assessment: 51 yo M Acute abdominal pain and chest pain 07/29/21.  On 1/3: localized to lower chest pain, with persistent nausea and vomiting 6 days following redo take down of an incarcerated hiatal hernia with gastropexy. CT  1/1 shows a large hiatal hernia and findings consistent with a gastric volvulus.  1/2 urgent EGD: gastric lumen fully decompressed & NGT placed Pharmacy consulted to manage TPN to provide nutrition in setting of gastric volvulus.   Glucose / Insulin: no hx DM, CBGs <125; 0 units SSI used in the past 24h, DC SSI/CBGs 1/7 Electrolytes: WNL, CoCa 9.38 Renal: SCr 1.07, BUN 24- slightly high Hepatic:  LFTS WNL 1/5; Trig 47 (1/4)  Intake / Output; MIVF:  I/O + 1567.8, NGO 100 mL yesterday; UOP 650 mL/ 24 hours Last BM 12/30 GI Imaging: CT 1/1 shows a large hiatal hernia and findings consistent with a gastric volvulus GI Surgeries / Procedures:  12/27  Takedown Nissen, reduction of incarcerated stomach in the chest with partial anterior diaphragmatic closure and gastropexy  1/2 : 1/2 urgent EGD: gastric lumen fulled decompressed & NGT placed, unable to reduce the gastric volvulus  Central access: PICC placed 1/3 for TPN TPN start date: 1/3  Nutritional Goals: Goal TPN rate is 85 mL/hr (provides 112 g of protein and 2101 kcals per day)  RD Assessment:Estimated Needs Total Energy Estimated Needs: 2100-2300 kcal Total Protein Estimated Needs: 105-120 grams Total Fluid Estimated Needs: >/= 2 L/day  Current Nutrition:  NPO TPN   Plan:  Continue TPN at goal rate (85 mL/hr) at 1800 and assess tolerance Electrolytes in TPN: Na 35mEq/L, K 71mEq/L, Ca 32mEq/L, Mg 22mEq/L, and Phos 74mmol/L. Cl:Ac 1:1 Add standard MVI  and trace elements to TPN DC CBGs &  SSI  Continue MIVF (D5NS + Kcl 20) at 15 mL/hr (or per CCS) Check BMET, Mg, and Phos on 1/7 and 1/8 Monitor TPN labs on Mon/Thurs ? Long-term nutrition plan If plan to DC on TPN could start to cycle  Eudelia Bunch, Pharm.D 08/04/2021 6:24 AM

## 2021-08-05 LAB — BASIC METABOLIC PANEL
Anion gap: 5 (ref 5–15)
BUN: 27 mg/dL — ABNORMAL HIGH (ref 6–20)
CO2: 26 mmol/L (ref 22–32)
Calcium: 8.4 mg/dL — ABNORMAL LOW (ref 8.9–10.3)
Chloride: 106 mmol/L (ref 98–111)
Creatinine, Ser: 1 mg/dL (ref 0.61–1.24)
GFR, Estimated: >60 mL/min (ref >60)
Glucose, Bld: 124 mg/dL — ABNORMAL HIGH (ref 70–99)
Potassium: 4 mmol/L (ref 3.5–5.1)
Sodium: 137 mmol/L (ref 135–145)

## 2021-08-05 LAB — MAGNESIUM: Magnesium: 2.2 mg/dL (ref 1.7–2.4)

## 2021-08-05 LAB — CBC
HCT: 34.8 % — ABNORMAL LOW (ref 39.0–52.0)
Hemoglobin: 10.8 g/dL — ABNORMAL LOW (ref 13.0–17.0)
MCH: 27.8 pg (ref 26.0–34.0)
MCHC: 31 g/dL (ref 30.0–36.0)
MCV: 89.5 fL (ref 80.0–100.0)
Platelets: 182 10*3/uL (ref 150–400)
RBC: 3.89 MIL/uL — ABNORMAL LOW (ref 4.22–5.81)
RDW: 15.5 % (ref 11.5–15.5)
WBC: 6.9 10*3/uL (ref 4.0–10.5)
nRBC: 0 % (ref 0.0–0.2)

## 2021-08-05 LAB — PHOSPHORUS: Phosphorus: 3.4 mg/dL (ref 2.5–4.6)

## 2021-08-05 MED ORDER — ACETAMINOPHEN 10 MG/ML IV SOLN
1000.0000 mg | Freq: Four times a day (QID) | INTRAVENOUS | Status: DC
  Administered 2021-08-05: 1000 mg via INTRAVENOUS
  Filled 2021-08-05: qty 100

## 2021-08-05 MED ORDER — ACETAMINOPHEN 10 MG/ML IV SOLN: 1000.0000 mg | Freq: Four times a day (QID) | INTRAVENOUS | Status: AC | PRN

## 2021-08-05 MED ORDER — TRAVASOL 10 % IV SOLN
INTRAVENOUS | Status: AC
  Filled 2021-08-05: qty 1122

## 2021-08-05 NOTE — Progress Notes (Signed)
PHARMACY - TOTAL PARENTERAL NUTRITION CONSULT NOTE   Indication: gastric volvulus  Patient Measurements: Height: 5\' 4"  (162.6 cm) Weight: 63.6 kg (140 lb 3.4 oz) IBW/kg (Calculated) : 59.2 TPN AdjBW (KG): 63.6 Body mass index is 24.07 kg/m. Usual Weight:   Assessment: 52 yo M Acute abdominal pain and chest pain 07/29/21.  On 1/3: localized to lower chest pain, with persistent nausea and vomiting 6 days following redo take down of an incarcerated hiatal hernia with gastropexy. CT  1/1 shows a large hiatal hernia and findings consistent with a gastric volvulus.  1/2 urgent EGD: gastric lumen fully decompressed & NGT placed Pharmacy consulted to manage TPN to provide nutrition in setting of gastric volvulus.   Glucose / Insulin: no hx DM, SSI/CBGs DC'd on 1/7 Electrolytes: WNL, CoCa WNL Renal: SCr 1.0, BUN 27- slightly high Hepatic:  LFTS WNL 1/5; Trig 47 (1/4)  Intake / Output; MIVF:  I/O + 781, NGO 500 mL yesterday; UOP 1050 mL/ 24 hours GI Imaging: CT 1/1 shows a large hiatal hernia and findings consistent with a gastric volvulus GI Surgeries / Procedures:  12/27  Takedown Nissen, reduction of incarcerated stomach in the chest with partial anterior diaphragmatic closure and gastropexy  1/2 : 1/2 urgent EGD: gastric lumen fulled decompressed & NGT placed, unable to reduce the gastric volvulus  Central access: PICC placed 1/3 for TPN TPN start date: 1/3  Nutritional Goals: Goal TPN rate is 85 mL/hr (provides 112 g of protein and 2101 kcals per day)  RD Assessment:Estimated Needs Total Energy Estimated Needs: 2100-2300 kcal Total Protein Estimated Needs: 105-120 grams Total Fluid Estimated Needs: >/= 2 L/day  Current Nutrition:  NPO TPN   Plan:  Continue TPN at goal rate (85 mL/hr) at 1800 Electrolytes in TPN: Na 61mEq/L, K 36mEq/L, Ca 60mEq/L, Mg 30mEq/L, and Phos 58mmol/L. Cl:Ac 1:1 Add standard MVI and trace elements to TPN Continue MIVF (D5NS + Kcl 20) at 15 mL/hr (or per  CCS) Monitor TPN labs on Mon/Thurs Per [facility] note:  Dr. 4m is communicating with the [facility] patient transfer coordinator.  [name], Pharm.D 08/05/2021 6:32 AM

## 2021-08-05 NOTE — Progress Notes (Signed)
6 Days Post-Op   Subjective/Chief Complaint: No new issues.  NGT working. Mobilizing in his room.   Objective: Vital signs in last 24 hours: Temp:  [97.3 F (36.3 C)-98.3 F (36.8 C)] 97.3 F (36.3 C) (01/08 0441) Pulse Rate:  [80-100] 100 (01/08 0441) Resp:  [18-20] 20 (01/08 0441) BP: (132-157)/(84-106) 157/106 (01/08 0441) SpO2:  [93 %-100 %] 98 % (01/08 0817) Last BM Date:  (UTA)  Intake/Output from previous day: 01/07 0701 - 01/08 0700 In: 2630.6 [I.V.:2630.6] Out: 1600 [Urine:1050; Emesis/NG output:550] Intake/Output this shift: No intake/output data recorded.  Abd: soft, NT, ND, incisions are healing well.  NGT in place and working well.  Lab Results:  Recent Labs    08/04/21 0210 08/05/21 0324  WBC 6.8 6.9  HGB 11.1* 10.8*  HCT 34.4* 34.8*  PLT 190 182   BMET Recent Labs    08/04/21 0210 08/05/21 0835  NA 136 137  K 3.8 4.0  CL 104 106  CO2 26 26  GLUCOSE 101* 124*  BUN 24* 27*  CREATININE 1.07 1.00  CALCIUM 8.5* 8.4*   PT/INR No results for input(s): LABPROT, INR in the last 72 hours. ABG No results for input(s): PHART, HCO3 in the last 72 hours.  Invalid input(s): PCO2, PO2  Studies/Results: No results found.  Anti-infectives: Anti-infectives (From admission, onward)    None       Assessment/Plan: Currently stable with NGT in place -awaiting [place] to [facility] early next week for surgical management by MD at their facility.  Dr. [name] is communication with the [facility] patient transfer coordinator. -cont TNA -mobilize  LOS: 7 days    Wynona Luna 08/05/2021

## 2021-08-05 NOTE — Discharge Summary (Signed)
Physician Discharge Summary  Patient ID: Garlan Drewes MRN: [id number] DOB/AGE: [birth year] 51 y.o.  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  Discharge Diagnoses:  Two weeks postop attempted reduction of volvulus was unsuccessful with persistent obstruction and NG tube dependence  Principal Problem:   Hiatal hernia Active Problems:   Chest pain   Post-operative nausea and vomiting   Gastric volvulus   Malnutrition of moderate degree   Surgery:  repeat robotic hiatal hernia repair was attempted on July 24, 2021 and the patient was discharged 07/28/2021 only to require readmission on July 29, 2021 this chest pain and obstruction  Discharged Condition: unchanged  Hospital Course:   The patient was admitted and on 07/30/2021 underwent upper endoscopy by Dr. Claudette Head who was able to pass and NG but but was unable to pass the scope distally in the stomach to visualize the pylorus.  The NG tube relieved his pain.  He was begun on TNA.  Once when his NG tube was disconnected he had recurrent abdominal and chest pain that once again resolved with successful NG suctioning.  His case was discussed with Dr. Mikel Cella who agreed to accept him in transfer on Monday or Tuesday in anticipation of surgery on Wednesday, 08/08/21.  He was examined by me on Tuesday, 08/07/21 and found safe and ready for transport to [facility].    Consults: Dr. Claudette Head  Significant Diagnostic Studies: repeat UGI showed persistent gastric volvulus    Discharge Exam: Blood pressure (!) 130/95, pulse 94, temperature 98.2 F (36.8 C), temperature source Oral, resp. rate 18, height 5\' 4"  (1.626 m), weight 63.6 kg, SpO2 100 %. Incision with Dermabond.  The incision that had a JP drain (removed after he went home the first time) is OK.    Disposition:  There are no questions and answers to display.          Allergies as of 08/07/2021   No Known Allergies      Medication List     STOP taking these medications    albuterol 108 (90 Base) MCG/ACT inhaler Commonly known as: VENTOLIN HFA   amLODipine 2.5 MG tablet Commonly known as: NORVASC   budesonide-formoterol 80-4.5 MCG/ACT inhaler Commonly known as: Symbicort   cholecalciferol 25 MCG (1000 UNIT) tablet Commonly known as: VITAMIN D   multivitamin with minerals Tabs tablet   ondansetron 4 MG disintegrating tablet Commonly known as: ZOFRAN-ODT   oxyCODONE 5 MG/5ML solution Commonly known as: ROXICODONE   oxyCODONE-acetaminophen 5-325 MG tablet Commonly known as: PERCOCET/ROXICET   rosuvastatin 5 MG tablet Commonly known as: CRESTOR   Zinc 50 MG Tabs         Signed: 10/05/2021 08/05/2021, 5:21 PM

## 2021-08-06 LAB — COMPREHENSIVE METABOLIC PANEL
ALT: 65 U/L — ABNORMAL HIGH (ref 0–44)
AST: 28 U/L (ref 15–41)
Albumin: 3.1 g/dL — ABNORMAL LOW (ref 3.5–5.0)
Alkaline Phosphatase: 94 U/L (ref 38–126)
Anion gap: 6 (ref 5–15)
BUN: 27 mg/dL — ABNORMAL HIGH (ref 6–20)
CO2: 26 mmol/L (ref 22–32)
Calcium: 8.6 mg/dL — ABNORMAL LOW (ref 8.9–10.3)
Chloride: 105 mmol/L (ref 98–111)
Creatinine, Ser: 1.03 mg/dL (ref 0.61–1.24)
GFR, Estimated: >60 mL/min (ref >60)
Glucose, Bld: 124 mg/dL — ABNORMAL HIGH (ref 70–99)
Potassium: 3.9 mmol/L (ref 3.5–5.1)
Sodium: 137 mmol/L (ref 135–145)
Total Bilirubin: 0.8 mg/dL (ref 0.3–1.2)
Total Protein: 6.3 g/dL — ABNORMAL LOW (ref 6.5–8.1)

## 2021-08-06 LAB — MAGNESIUM: Magnesium: 2.2 mg/dL (ref 1.7–2.4)

## 2021-08-06 LAB — PHOSPHORUS: Phosphorus: 3.9 mg/dL (ref 2.5–4.6)

## 2021-08-06 LAB — GLUCOSE, CAPILLARY: Glucose-Capillary: 101 mg/dL — ABNORMAL HIGH (ref 70–99)

## 2021-08-06 LAB — TRIGLYCERIDES: Triglycerides: 50 mg/dL (ref <150)

## 2021-08-06 MED ORDER — TRAVASOL 10 % IV SOLN
INTRAVENOUS | Status: DC
  Filled 2021-08-06: qty 1122

## 2021-08-06 NOTE — Progress Notes (Signed)
PHARMACY - TOTAL PARENTERAL NUTRITION CONSULT NOTE   Indication: gastric volvulus  Patient Measurements: Height: 5\' 4"  (162.6 cm) Weight: 63.6 kg (140 lb 3.4 oz) IBW/kg (Calculated) : 59.2 TPN AdjBW (KG): 63.6 Body mass index is 24.07 kg/m. Usual Weight:   Assessment: 51 yo M Acute abdominal pain and chest pain 07/29/21.  On 1/3: localized to lower chest pain, with persistent nausea and vomiting 6 days following redo take down of an incarcerated hiatal hernia with gastropexy. CT  1/1 shows a large hiatal hernia and findings consistent with a gastric volvulus.  1/2 urgent EGD: gastric lumen fully decompressed & NGT placed Pharmacy consulted to manage TPN to provide nutrition in setting of gastric volvulus.   Glucose / Insulin: no hx DM, SSI/CBGs DC'd on 1/7 - cbgs: 124 from cmet Electrolytes: WNL including CorrCa Renal: SCr stable at 1.03; BUN slightly elevated at 27 Hepatic:  ALT slightly elevated at 65; Trig 47 (1/4)  Intake / Output; MIVF: D5 NS with 20 meq/L at 15 ml/hr - I/O: + 969 ml - NG output: 800 ml - UOP: 675 ml GI Imaging:  - 1/1 CT: shows a large hiatal hernia and findings consistent with a gastric volvulus GI Surgeries / Procedures:  - 12/27  Takedown Nissen, reduction of incarcerated stomach in the chest with partial anterior diaphragmatic closure and gastropexy  - 1/2: urgent EGD: gastric lumen fulled decompressed & NGT placed, unable to reduce the gastric volvulus  Central access: PICC placed 1/3 for TPN TPN start date: 1/3  Nutritional Goals: Goal TPN rate is 85 mL/hr (provides 112 g of protein and 2101 kcals per day)  RD Assessment:Estimated Needs Total Energy Estimated Needs: 2100-2300 kcal Total Protein Estimated Needs: 105-120 grams Total Fluid Estimated Needs: >/= 2 L/day  Current Nutrition:  NPO TPN   Plan:   At 1800: - Continue TPN at goal rate 85 mL/hr  - Electrolytes in TPN:  Na 57mEq/L K 17mEq/L Ca 82mEq/L Mg 68mEq/L Phos 8 mmol/L Cl:Ac  1:1 - Add standard MVI and trace elements to TPN - Continue MIVF (D5NS + Kcl 20) at 15 mL/hr (or per CCS) - Monitor TPN labs on Mon/Thurs - bmet, phos, mag on 1/10 - plan is to transfer to [facility] for surgical management  [facility], PharmD, BCPS 08/06/2021 7:41 AM

## 2021-08-07 LAB — BASIC METABOLIC PANEL
Anion gap: 3 — ABNORMAL LOW (ref 5–15)
BUN: 25 mg/dL — ABNORMAL HIGH (ref 6–20)
CO2: 27 mmol/L (ref 22–32)
Calcium: 8.7 mg/dL — ABNORMAL LOW (ref 8.9–10.3)
Chloride: 107 mmol/L (ref 98–111)
Creatinine, Ser: 1.01 mg/dL (ref 0.61–1.24)
GFR, Estimated: >60 mL/min (ref >60)
Glucose, Bld: 117 mg/dL — ABNORMAL HIGH (ref 70–99)
Potassium: 4 mmol/L (ref 3.5–5.1)
Sodium: 137 mmol/L (ref 135–145)

## 2021-08-07 LAB — PHOSPHORUS: Phosphorus: 3.2 mg/dL (ref 2.5–4.6)

## 2021-08-07 LAB — MAGNESIUM: Magnesium: 2 mg/dL (ref 1.7–2.4)

## 2021-08-07 MED ORDER — TRAVASOL 10 % IV SOLN
INTRAVENOUS | Status: DC
  Filled 2021-08-07: qty 1122

## 2021-08-07 NOTE — Progress Notes (Signed)
Patient seen and examined 08/07/2021  Patient is not having any discomfort with NG in place to intermittent suction.   Vital signs are stable Incisions are healing and bland.    Patient is able to be transferred to [facility]. [name], MD, FACS

## 2021-08-07 NOTE — Progress Notes (Signed)
This RN called Priscille Kluver RN @ [facility] to let him know the patient has enough TPN for his ride from here to there but will need a new bag of TPN on arrival. [place] voiced understanding.

## 2021-08-07 NOTE — Progress Notes (Signed)
This RN called report to [employer]. All questions addressed. Pt going to room 3106 at [address] [place] [address]. [facility] has been called for transport and pt and wife made aware.

## 2021-08-07 NOTE — TOC Transition Note (Signed)
Transition of Care [facility]) - CM/SW Discharge Note   Patient Details  Name: Raymond Watson MRN: [id number] Date of Birth: May 12, 1971  [facility] CM/SW Contact:  Lanier Clam, RN Phone Number: 08/07/2021, 10:06 AM   Clinical Narrative: For Acute to Acute transfer t [facility] to manage [facility]. No further CM needs.      Final next level of care: Acute to Acute Transfer Barriers to Discharge: No Barriers Identified   Patient Goals and CMS Choice        Discharge Placement                       Discharge Plan and Services                                     Social Determinants of Health (SDOH) Interventions     Readmission Risk Interventions No flowsheet data found.

## 2021-08-07 NOTE — Progress Notes (Signed)
Patient stable and arrangements are being made for transport to [place] today or tomorrow.  PE is stable.  No new complaints.    Raymond Watson

## 2021-08-07 NOTE — Progress Notes (Signed)
Received call from transport team at [facility] who requested last set of vitals and report on this patient. Transport team reported they would call me back once they have a bed assignment for him today. Pt updated.

## 2021-08-07 NOTE — Progress Notes (Signed)
PHARMACY - TOTAL PARENTERAL NUTRITION CONSULT NOTE   Indication: gastric volvulus  Patient Measurements: Height: 5\' 4"  (162.6 cm) Weight: 62.5 kg (137 lb 12.8 oz) IBW/kg (Calculated) : 59.2 TPN AdjBW (KG): 63.6 Body mass index is 23.65 kg/m. Usual Weight:   Assessment: 51 yo M Acute abdominal pain and chest pain 07/29/21.  On 1/3: localized to lower chest pain, with persistent nausea and vomiting 6 days following redo take down of an incarcerated hiatal hernia with gastropexy. CT  1/1 shows a large hiatal hernia and findings consistent with a gastric volvulus.  1/2 urgent EGD: gastric lumen fully decompressed & NGT placed Pharmacy consulted to manage TPN to provide nutrition in setting of gastric volvulus.   Glucose / Insulin: no hx DM, SSI/CBGs DC'd on 1/7 - cbgs: 117 from cmet Electrolytes: WNL including CorrCa Renal: SCr stable at 1.03; BUN slightly elevated at 25 Hepatic:  ALT slightly elevated at 65; Trig 47 (1/4)  Intake / Output; MIVF: D5 NS with 20 meq/L at 15 ml/hr - I/O: + 1660 ml - NG output: 250 ml - UOP: 850 ml GI Imaging:  - 1/1 CT: shows a large hiatal hernia and findings consistent with a gastric volvulus GI Surgeries / Procedures:  - 12/27  Takedown Nissen, reduction of incarcerated stomach in the chest with partial anterior diaphragmatic closure and gastropexy  - 1/2: urgent EGD: gastric lumen fulled decompressed & NGT placed, unable to reduce the gastric volvulus  Central access: PICC placed 1/3 for TPN TPN start date: 1/3  Nutritional Goals: Goal TPN rate is 85 mL/hr (provides 112 g of protein and 2101 kcals per day)  RD Assessment:Estimated Needs Total Energy Estimated Needs: 2100-2300 kcal Total Protein Estimated Needs: 105-120 grams Total Fluid Estimated Needs: >/= 2 L/day  Current Nutrition:  NPO TPN   Plan:   At 1800: - Continue TPN at goal rate 85 mL/hr  - Electrolytes in TPN:  Na 50mEq/L K 57mEq/L Ca 60mEq/L Mg 74mEq/L Phos 8 mmol/L Cl:Ac  1:1 - Add standard MVI and trace elements to TPN - Continue MIVF (D5NS + Kcl 20) at 15 mL/hr (or per CCS) - Monitor TPN labs on Mon/Thurs - plan is to transfer to [facility] for surgical management  [facility], PharmD, BCPS 08/07/2021 7:10 AM

## 2021-08-07 NOTE — Progress Notes (Signed)
Patient ID: Raymond Watson, male   DOB: Mar 03, 1971, 51 y.o.   MRN: [id number] Mr. Iadarola will hopefully be getting a bed at [facility] as he is scheduled to have surgery by Dr. Normajean Glasgow tomorrow (Wednesday).    The patient has been and is stable.  His wounds are healing and he has a PIC line and an NG in place.    Matt B. Hassell Done, MD, FACS

## 2021-12-06 ENCOUNTER — Ambulatory Visit: Payer: BLUE CROSS/BLUE SHIELD | Admitting: Internal Medicine

## 2021-12-06 NOTE — Progress Notes (Deleted)
Raymond Watson, male    DOB: 01-Oct-1970  MRN: [id number]   Brief patient profile:  51  yo black male never smoker/ never vaccinated  with h/o gerd and hyperlipidemia then acutely ill around Apr 28 2020   [facility] Discharge Summary    Admit date: 04/29/2020 Discharge date: 05/10/2020 Length of stay: LOS: 11 days    Discharge Diagnoses  Principal Problem: Pneumonia due to COVID-19 virus Active Problems: Hypoxia Diarrhea Hyperlipemia AKI (acute kidney injury) (CMS-HCC) Hiatal hernia Acute respiratory failure with hypoxia (CMS-HCC)    [name] hello 51 year old gentleman 44 with history of hypertension comes with shortness of breath noted to have pneumonia associated with COVID-19 virus patient was admitted to hospital treated with antibiotics remdesivir and anticoagulation and followed closely management he slowly started to improve his oxygen level was better and lately at 4 L nasal cannula he was stable activity was increased and was discharged on medications noted below with a plan to follow him up in 1 week.      History of Present Illness  07/24/2020  Pulmonary/ 1st office eval/ Raymond Watson / [place] [facility]  Chief Complaint  Patient presents with   Consult    No complaints currently, currently on 2L O2  Dyspnea:  Mailbox is downhill from house and can back up hill to house s stopping at slower than usual pace but sats so far above 90% s  02  Cough: much better  Sleep: sleeps on pillows at about 30 degrees  SABA use: symbicort 160 2bid / twice albuterol  02 2lpm concentrator at hs and 24/7. rx Plan A = Automatic = Always=    Symbicort 160 Take 1- 2 puffs first thing in am and then another 1-2 puffs about 12 hours later.  Work on inhaler technique:   Plan B = Backup (to supplement plan A, not to replace it) Only use your albuterol inhaler as a rescue medication  Ok to try albuterol (on alternate days) 15 min before an activity that you know would  make you short of breath and see if it makes any difference and if makes none then don't take it after activity unless you can't catch your breath. Make sure you check your oxygen saturation  at your highest level of activity  to be sure it stays over 90% and adjust  02 flow upward to maintain this level if needed but remember to turn it back to previous settings when you stop (to conserve your supply).  Continue 2lpm at bedtime and the rest of the day only if needed    08/08/2020  f/u ov/[place] office/Raymond Watson re: post covid doe / bronchiectasis on ct on symbicort 160 2 in am / 1 in pm Chief Complaint  Patient presents with   Follow-up    Breathing is unchanged since the last visit. He has only used his albuterol x 1 since his last visit 07/24/20. He states using o2 with sleep but not during the day since sats have stayed above 90% ra.   Dyspnea:  Mailbox and back better sats 94% s cp on exr  Cough: better / min mucoid Sleeping: flat bed, bunch of pillows SABA use: rarely 02: 2lph hs  Chest discomfort L of sternum / never noct  / never pleuritic or ex  Can't do steps due to sob and job in [employer]  requires lifting/ steps rec Work on inhaler technique:    To get the most out of exercise, you need to  be continuously aware that you are short of breath, but never out of breath, for 30 minutes twice daily.   Make sure you check your oxygen saturation at your highest level of activity to be sure it stays over 90% and keep track of it at least once a week, more often if breathing getting worse, and let me know if losing ground.   I very strongly recommend you get the moderna or pfizer vaccine   GERD  Diet  Please remember to go to the lab department @ [facility] for your tests - we will call you with the results when they are available.          09/21/2020  f/u ov/[place] office/Raymond Watson re: pos covid 04/2020 Chief Complaint  Patient presents with   Follow-up    PFT Watson 09/19/20.  Breathing is improved since the last visit. No new co's. He has not had to use his rescue inhaler.   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   Make sure you check your oxygen saturation at your highest level of activity to be sure it stays over 90%   I will be referring you to Dr Kaylyn Lim re: your large Hiatal Hernia  Please schedule a follow up office visit in 6 weeks, call sooner if needed    11/13/2020  f/u ov/[place] office/Raymond Watson re: doe p covid/ bronchiectasis with minimal airflow obst Chief Complaint  Patient presents with   Follow-up    Breathing has improved since the last visit and no new co's today. He rarely uses his albuterol.   Dyspnea: walking to farm x one mile there and back 95%  Twice daily / steps x 5 landing  Cough: none  Sleeping: fine resp wise  SABA use: just symbicort  02: none  Covid status: never vaccinated  Lung cancer screening: never  Smoker Cannot return for light duty and has not been paid since Feb 22nd  Rec To get the most out of exercise, you need to be continuously aware that you are short of breath, but never out of breath, for at least 30 minutes at least twice daily.   We will cancel your oxygen   Please schedule a follow up visit in 3 months but call sooner if needed  with all medications /inhalers/ solutions in hand    12/04/20  Lap Hernia repair Raymond Watson)   02/08/2021  f/u ov/[place] office/Raymond Watson re: doe p covid/ bronchiectasis with minimal airflow obst Chief Complaint  Patient  presents with   Follow-up    Patient is feeling good overall, no concerns at this time.  Dyspnea:  walking 30 plus min at 70mph, almost flat sats remain  97% thuout per pt  Cough: none  Sleeping: able to lie flat/ 2 pillows SABA use: just symbicort 160 2bid  02: none / Covid status: never did get vax  - had delta in October  Lung cancer screening: never smoker  Rec To get the most out  of exercise, you need to be continuously aware that you are short of breath, but never out of breath, for at least 30 minutes daily.  Decrease the symbicort 80 to Take 1- 2 puffs first thing in am and then another 2 puffs about 12 hours later.     06/01/2021  f/u ov/[place] office/Raymond Watson re: bronchiectasis with min obst maint on symbicort 80 one puff  bid  Chief Complaint  Patient presents with   Follow-up    10/24 flu+ Other than the flu no issues since last OV   Dyspnea:  Not limited by breathing from desired activities   Treadmill 5 x per week/ 30 min at / 0 grade and sats upper 90's RA Cough: none  Sleeping: flat bed / two pillow  SABA use: rarely  02: none since April 2022  Covid status: never vax  Still having midline chest /epigastric discomfort when bend over or eats chicken  Rec Plan A = Automatic = Always=    Symbicort 80 Take 1-2 puffs first thing in am and then another 1-2 puffs about 12 hours later.  Work on inhaler technique:   Plan B = Backup (to supplement plan A, not to replace it) Only use your albuterol inhaler as a rescue medication  See Dr [name] about the new pains you have having over your lower chest and upper abdomen  that have developed since your surgery     12/06/2021  f/u ov/[place] office/Raymond Watson re: *** maint on ***  No chief complaint on file.   Dyspnea:  *** Cough: *** Sleeping: *** SABA use: *** 02: *** Covid status: *** Lung cancer screening: ***   No obvious day to day or daytime variability or assoc excess/ purulent sputum or mucus plugs or  hemoptysis or cp or chest tightness, subjective wheeze or overt sinus or hb symptoms.   *** without nocturnal  or early am exacerbation  of respiratory  c/o's or need for noct saba. Also denies any obvious fluctuation of symptoms with weather or environmental changes or other aggravating or alleviating factors except as outlined above   No unusual exposure hx or h/o childhood pna/ asthma or knowledge of premature birth.  Current Allergies, Complete Past Medical History, Past Surgical History, Family History, and Social History were reviewed in [employer] record.  ROS  The following are not active complaints unless bolded Hoarseness, sore throat, dysphagia, dental problems, itching, sneezing,  nasal congestion or discharge of excess mucus or purulent secretions, ear ache,   fever, chills, sweats, unintended wt loss or wt gain, classically pleuritic or exertional cp,  orthopnea pnd or arm/hand swelling  or leg swelling, presyncope, palpitations, abdominal pain, anorexia, nausea, vomiting, diarrhea  or change in bowel habits or change in bladder habits, change in stools or change in urine, dysuria, hematuria,  rash, arthralgias, visual complaints, headache, numbness, weakness or ataxia or problems with walking or coordination,  change in mood or  memory.        No outpatient medications have been marked as taking for the 12/06/21 encounter (Appointment) with Nyoka Cowden, MD.                  Past Medical History:  Diagnosis Date   Hyperlipidemia    Pneumonia        Objective:    Wts  12/06/2021        ***  06/01/2021       143  02/08/2021  143 11/13/2020       173  09/21/20 174 lb 12.8 oz (79.3 kg)  08/08/20 174 lb (78.9 kg)  07/24/20 171 lb 6.4 oz (77.7 kg)     Vital signs reviewed  12/06/2021  - Note at rest 02 sats  ***% on ***   General appearance:    ***         CXR PA and Lateral:   06/01/2021 :    I personally reviewed images and agree with  radiology impression as follows:    Negative for acute cardiopulmonary disease.  Hiatal hernia, similar to the comparison chest x-ray My review:  HH looks similar to preop appearance       Assessment

## 2021-12-10 ENCOUNTER — Ambulatory Visit (HOSPITAL_COMMUNITY)
Admission: RE | Admit: 2021-12-10 | Discharge: 2021-12-10 | Disposition: A | Discharge status: Home / Self Care | Payer: BLUE CROSS/BLUE SHIELD | Source: Ambulatory Visit | Attending: Internal Medicine | Admitting: Internal Medicine

## 2021-12-10 ENCOUNTER — Encounter: Payer: Self-pay | Admitting: Internal Medicine

## 2021-12-10 ENCOUNTER — Ambulatory Visit (INDEPENDENT_AMBULATORY_CARE_PROVIDER_SITE_OTHER): Payer: BLUE CROSS/BLUE SHIELD | Admitting: Internal Medicine

## 2021-12-10 DIAGNOSIS — R0609 Other forms of dyspnea: Secondary | ICD-10-CM | POA: Diagnosis not present

## 2021-12-10 DIAGNOSIS — J479 Bronchiectasis, uncomplicated: Secondary | ICD-10-CM

## 2021-12-10 MED ORDER — FAMOTIDINE 20 MG PO TABS: ORAL_TABLET | ORAL | 11 refills | Status: DC

## 2021-12-10 MED ORDER — PANTOPRAZOLE SODIUM 40 MG PO TBEC: 40.0000 mg | DELAYED_RELEASE_TABLET | Freq: Every day | ORAL | 2 refills | Status: AC

## 2021-12-10 NOTE — Progress Notes (Signed)
Raymond Watson, male    DOB: [birth year]  MRN: [id number]   Brief patient profile:  51  yo black male never smoker/ never vaccinated  with h/o gerd and hyperlipidemia then acutely ill around Apr 28 2020   [facility] Internal Medicine Discharge Summary    Admit date: 04/29/2020 Discharge date: 05/10/2020 Length of stay: LOS: 11 days    Discharge Diagnoses  Principal Problem: Pneumonia due to COVID-19 virus Active Problems: Hypoxia Diarrhea Hyperlipemia AKI (acute kidney injury) (CMS-HCC) Hiatal hernia Acute respiratory failure with hypoxia (CMS-HCC)    Hospital Course  Hello hello 51 year old gentleman 51 with history of hypertension comes with shortness of breath noted to have pneumonia associated with COVID-19 virus patient was admitted to hospital treated with antibiotics remdesivir and anticoagulation and followed closely management he slowly started to improve his oxygen level was better and lately at 4 L nasal cannula he was stable activity was increased and was discharged on medications noted below with a plan to follow him up in 1 week.      History of Present Illness  07/24/2020  Pulmonary/ 1st office eval/ Raymond Watson / [place] [facility]  Chief Complaint  Patient presents with   Consult    No complaints currently, currently on 2L O2  Dyspnea:  Mailbox is downhill from house and can back up hill to house s stopping at slower than usual pace but sats so far above 90% s  02  Cough: much better  Sleep: sleeps on pillows at about 30 degrees  SABA use: symbicort 160 2bid / twice albuterol  02 2lpm concentrator at hs and 24/7. rx Plan A = Automatic = Always=    Symbicort 160 Take 1- 2 puffs first thing in am and then another 1-2 puffs about 12 hours later.  Work on inhaler technique:   Plan B = Backup (to supplement plan A, not to replace it) Only use your albuterol inhaler as a rescue medication  Ok to try albuterol (on alternate days) 15 min before an activity that you know would  make you short of breath and see if it makes any difference and if makes none then don't take it after activity unless you can't catch your breath. Make sure you check your oxygen saturation  at your highest level of activity  to be sure it stays over 90% and adjust  02 flow upward to maintain this level if needed but remember to turn it back to previous settings when you stop (to conserve your supply).  Continue 2lpm at bedtime and the rest of the day only if needed    12/04/20  Lap Hernia repair Raymond Watson)    06/01/2021  f/u ov/[place] office/Raymond Watson re: bronchiectasis with min obst maint on symbicort 80 one puff  bid  Chief Complaint  Patient presents with   Follow-up    10/24 flu+ Other than the flu no issues since last OV   Dyspnea:  Not limited by breathing from desired activities   Treadmill 5 x per week/ 30 min at / 0 grade and sats upper 90's RA Cough: none  Sleeping: flat bed / two pillow  SABA use: rarely  02: none since April 2022  Covid status: never vax  Still having midline chest /epigastric discomfort when bend over or eats chicken  Rec Plan A = Automatic = Always=    Symbicort 80 Take 1-2 puffs first thing in am and then another 1-2 puffs about 12 hours later.  Work on inhaler technique:  Plan B = Backup (to supplement plan A, not to replace it) Only use your albuterol inhaler as a rescue medication  See Raymond Watson about the new pains you have having over your lower chest and upper abdomen  that have developed since your surgery    Jan 11th  2023 Underwent combination robotic assisted laparoscopy and L thoracotomy for repair of recurrent paraesophageal hernia, PEG tube [facility]  [facility] > returned to work May 8th     12/10/2021  f/u ov/ office/Raymond Watson re: bronchiectasis with min obst/  maint on symb 160 2bid   Chief Complaint  Patient presents with   Follow-up    Chest hurting when using inhaler and coughing   Dyspnea:  nothing makes him sob, just fatigued   Cough: after supper non productive  Sleeping: flat bed 2 pillows / no resp  cc  SABA use: none  02: none Covid status: vax none /  infected x 1 2021    C/o midline ant cp ever since surgery every time he takes deep breath/ no radiation / positional component also   No obvious day to day or daytime variability or assoc excess/ purulent sputum or mucus plugs or hemoptysis orchest tightness, subjective wheeze or overt sinus or hb symptoms.   Sleeping  without nocturnal  or early am exacerbation  of respiratory  c/o's or need for noct saba. Also denies any obvious fluctuation of symptoms with weather or environmental changes or other aggravating or alleviating factors except as outlined above   No unusual exposure hx or h/o childhood pna/ asthma or knowledge of premature birth.  Current Allergies, Complete Past Medical History, Past Surgical History, Family History, and Social History were reviewed in [name].  ROS  The following are not active complaints unless bolded Hoarseness, sore throat, dysphagia, dental problems, itching, sneezing,  nasal congestion or discharge of excess mucus or purulent secretions, ear ache,   fever, chills, sweats, unintended wt loss or wt gain, classically pleuritic (lateralizing) or exertional cp,  orthopnea pnd or arm/hand swelling  or leg swelling, presyncope, palpitations, abdominal pain, anorexia, nausea, vomiting, diarrhea  or change in bowel habits or change in bladder habits, change in stools or change in urine, dysuria, hematuria,  rash, arthralgias, visual complaints, headache, numbness, weakness or ataxia or problems with walking or coordination,  change in mood or  memory.        Current Meds  Medication Sig   albuterol (VENTOLIN HFA) 108 (90 Base) MCG/ACT inhaler Inhale into the lungs every 6 (six) hours as needed for wheezing or shortness of breath.   amlodipine-atorvastatin (CADUET) 10-20 MG tablet Take 1 tablet by  mouth daily.   rosuvastatin (CRESTOR) 5 MG tablet Take by mouth.   SYMBICORT 160-4.5 MCG/ACT inhaler Inhale into the lungs.                  Past Medical History:  Diagnosis Date   Hyperlipidemia    Pneumonia        Objective:    Wts  12/10/2021       143  06/01/2021       143  02/08/2021       143 11/13/2020       173  09/21/20 174 lb 12.8 oz (79.3 kg)  08/08/20 174 lb (78.9 kg)  07/24/20 171 lb 6.4 oz (77.7 kg)     Vital signs reviewed  12/10/2021  - Note at rest 02 sats  99% on RA  General appearance:    amb bm nad     HEENT : Oropharynx  clear/ dentition intact  Nasal turbintes nl    NECK :  without  appent JVD/ palpable Nodes/TM    LUNGS: no acc muscle use,  Nl contour chest which is clear to A and P bilaterally without cough on insp or exp maneuvers   CV:  RRR  no s3 or murmur or increase in P2, and no edema   ABD:  scars well healed/ soft and nontender with nl inspiratory excursion in the supine position. No bruits or organomegaly appreciated   MS:  Nl gait/ ext warm without deformities Or obvious joint restrictions  calf tenderness, cyanosis or clubbing     SKIN: warm and dry without lesions    NEURO:  alert, approp, nl sensorium with  no motor or cerebellar deficits apparent.        CXR PA and Lateral:   12/10/2021 :    I personally reviewed images and agree with radiology impression as follows:  Postsurgical changes. No hiatal hernia identified. No cause for pain identified.          Assessment

## 2021-12-10 NOTE — Patient Instructions (Addendum)
Stop symbicort for now and just use it as needed up to 2 puffs every 12 hours and see if really helps you or not. ? ?Start Pantoprazole (protonix) 40 mg   Take  30-60 min before first meal of the day and Pepcid (famotidine)  20 mg after supper until return to office - this is the best way to tell whether stomach acid is contributing to your problem.   ? ?GERD (REFLUX)  is an extremely common cause of respiratory symptoms just like yours , many times with no obvious heartburn at all.  ? ? It can be treated with medication, but also with lifestyle changes including elevation of the head of your bed (ideally with 6 -8inch blocks under the headboard of your bed),  Smoking cessation, avoidance of late meals, excessive alcohol, and avoid fatty foods, chocolate, peppermint, colas, red wine, and acidic juices such as orange juice.  ?NO MINT OR MENTHOL PRODUCTS SO NO COUGH DROPS  ?USE SUGARLESS CANDY INSTEAD (Jolley ranchers or Stover's or Life Savers) or even ice chips will also do - the key is to swallow to prevent all throat clearing. ?NO OIL BASED VITAMINS - use powdered substitutes.  Avoid fish oil when coughing.  ? ?We will schedule the PFTs in [place] next available  ? ?Please remember to go to the  x-ray department  @  [facility] for your tests - we will call you with the results when they are available    ? ?Please schedule a follow up visit in 3 months but call sooner if needed  ? ? ? ? ?

## 2021-12-13 ENCOUNTER — Encounter: Payer: Self-pay | Admitting: Internal Medicine

## 2021-12-13 NOTE — Assessment & Plan Note (Addendum)
CT chest 06/09/20 p covid (dx 04/29/20)  changes with tubular bronchiectasis both lower lobes -  Repeat CT = HRCT rec for 12/07/20 (placed in reminder file )  - 07/24/2020   Demonstrated approp hfa > use symbicort 160 1-2 bid and approp saba  - 08/08/2020  After extensive coaching inhaler device,  effectiveness =    75% from baseline 25%  - PFT's 09/19/20  No obstruction p symbicort in am   - HRCT 12/28/2020 1. 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 2. Small pulmonary nodules measuring 5 mm or less in size, stable compared to the prior examination, strongly favored to be benign. No follow-up needed if patient is low-risk (and has no known or suspected primary neoplasm).> never smoker, no f/u needed - 12/10/2021  After extensive coaching inhaler device,  effectiveness =    90% > try off symbicort to see what effects if any this now has   Since no obst on pfts no need for maint symbicort > try off but place back on gerd rx as having cough p supper   F/u in 3 m with pfts off symbicort and post chest surgery for large [facility]   Each maintenance medication was reviewed in detail including emphasizing most importantly the difference between maintenance and prns and under what circumstances the prns are to be triggered using an action plan format where appropriate.  Total time for H and P, chart review, counseling, reviewing hfa device(s) , directly observing portions of ambulatory 02 saturation study/ and generating customized AVS unique to this office visit / same day charting = > 30 min

## 2021-12-13 NOTE — Assessment & Plan Note (Signed)
Onset was Apr 28 2020 with covid  -  07/24/2020   Walked RA  approx   600 ft  @ fast pace  stopped due to  End of study s sob and sats 96% - PFT's  09/19/2020  FEV1 2.27 (69 % ) ratio 0.68  p 0 % improvement from saba p symbicort  prior to study with DLCO  24.89 (72%) corrects to 4.55 (106%)  for alv volume and FV curve nl   - 09/21/2020 )- walked moderate pace/2 additional laps = 500 ft- sat at end 96% ra and pulse 124/No SOB  - 12/10/2021   Walked on RA  x  3  lap(s) =  approx 450  ft  @ fast pace, stopped due to end of study s sob with lowest 02 sats 95%   Not limited by breathing from desired activities  > no additional w/u planned - with this baseline and no obst on pfts ok to d/c symbicort restart if losing ground.

## 2022-01-21 ENCOUNTER — Ambulatory Visit (INDEPENDENT_AMBULATORY_CARE_PROVIDER_SITE_OTHER): Payer: BLUE CROSS/BLUE SHIELD | Admitting: Internal Medicine

## 2022-01-21 DIAGNOSIS — J479 Bronchiectasis, uncomplicated: Secondary | ICD-10-CM

## 2022-01-21 LAB — PULMONARY FUNCTION TEST
DL/VA % pred: 109 %
DL/VA: 4.9 ml/min/mmHg/L
DLCO cor % pred: 80 %
DLCO cor: 19.84 ml/min/mmHg
DLCO unc % pred: 80 %
DLCO unc: 19.84 ml/min/mmHg
FEF 25-75 Post: 4 L/sec
FEF 25-75 Pre: 4.28 L/sec
FEF2575-%Change-Post: -6 %
FEF2575-%Pred-Post: 139 %
FEF2575-%Pred-Pre: 148 %
FEV1-%Change-Post: 4 %
FEV1-%Pred-Post: 84 %
FEV1-%Pred-Pre: 80 %
FEV1-Post: 2.72 L
FEV1-Pre: 2.6 L
FEV1FVC-%Change-Post: 1 %
FEV1FVC-%Pred-Pre: 118 %
FEV6-%Change-Post: 3 %
FEV6-%Pred-Post: 73 %
FEV6-%Pred-Pre: 71 %
FEV6-Post: 2.95 L
FEV6-Pre: 2.85 L
FEV6FVC-%Pred-Post: 104 %
FEV6FVC-%Pred-Pre: 104 %
FVC-%Change-Post: 3 %
FVC-%Pred-Post: 70 %
FVC-%Pred-Pre: 68 %
FVC-Post: 2.95 L
FVC-Pre: 2.85 L
Post FEV1/FVC ratio: 92 %
Post FEV6/FVC ratio: 100 %
Pre FEV1/FVC ratio: 91 %
Pre FEV6/FVC Ratio: 100 %
RV % pred: 44 %
RV: 0.82 L
TLC % pred: 64 %
TLC: 3.87 L

## 2022-01-21 NOTE — Progress Notes (Signed)
Full PFT performed today. °

## 2022-03-11 ENCOUNTER — Ambulatory Visit (INDEPENDENT_AMBULATORY_CARE_PROVIDER_SITE_OTHER): Payer: BLUE CROSS/BLUE SHIELD | Admitting: Internal Medicine

## 2022-03-11 ENCOUNTER — Encounter: Payer: Self-pay | Admitting: Internal Medicine

## 2022-03-11 DIAGNOSIS — J479 Bronchiectasis, uncomplicated: Secondary | ICD-10-CM | POA: Diagnosis not present

## 2022-03-11 NOTE — Assessment & Plan Note (Signed)
CT chest 06/09/20 p covid (dx 04/29/20)  changes with tubular bronchiectasis both lower lobes -  Repeat CT = HRCT rec for 12/07/20 (placed in reminder file )  - 07/24/2020   Demonstrated approp hfa > use symbicort 160 1-2 bid and approp saba  - 08/08/2020  After extensive coaching inhaler device,  effectiveness =    75% from baseline 25%  - PFT's 09/19/20  No obstruction p symbicort in am   - HRCT 12/28/2020 1. 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 2. Small pulmonary nodules measuring 5 mm or less in size, stable compared to the prior examination, strongly favored to be benign. No follow-up needed if patient is low-risk (and has no known or suspected primary neoplasm).> never smoker, no f/u needed - 12/10/2021  After extensive coaching inhaler device,  effectiveness =    90% > try off symbicort > no change  - PFTs  01/21/22  wnl   His ex tol is fine and only impact going forward is increased susceptibility to contaminants in the air ( or viruses/ bacteria/ aspiration from UGI issues)  due to assoc mucociliary escalator dysfunction > advised to avoid breathing fumes/ dusts and keep up with all vaccinations per PCP  Pulmonary f/u is prn      Each maintenance medication was reviewed in detail including emphasizing most importantly the difference between maintenance and prns and under what circumstances the prns are to be triggered using an action plan format where appropriate.  Total time for H and P, chart review, counseling, reviewing hfa  device(s) and generating customized AVS unique to this office visit / same day charting = 25 min summary final pulmonary ov

## 2022-03-11 NOTE — Progress Notes (Signed)
Raymond Watson, male    DOB: [birth year]  MRN: [id number]   Brief patient profile:  51  yo black male never smoker/ never vaccinated  with h/o gerd and hyperlipidemia then acutely ill around Apr 28 2020 with covid    [facility] Discharge Summary    Admit date: 04/29/2020 Discharge date: 05/10/2020 Length of stay: LOS: 11 days    Discharge Diagnoses  Principal Problem: Pneumonia due to COVID-19 virus Active Problems: Hypoxia Diarrhea Hyperlipemia AKI (acute kidney injury) (CMS-HCC) Hiatal hernia Acute respiratory failure with hypoxia (CMS-HCC)    Hospital Course  Hello hello 51 year old gentleman 1 with history of hypertension comes with shortness of breath noted to have pneumonia associated with COVID-19 virus patient was admitted to hospital treated with antibiotics remdesivir and anticoagulation and followed closely management he slowly started to improve his oxygen level was better and lately at 4 L nasal cannula he was stable activity was increased and was discharged on medications noted below with a plan to follow him up in 1 week.      History of Present Illness  07/24/2020  Pulmonary/ 1st office eval/ Raymond Watson / [place] [facility]  Chief Complaint  Patient presents with   Consult    No complaints currently, currently on 2L O2  Dyspnea:  Mailbox is downhill from house and can back up hill to house s stopping at slower than usual pace but sats so far above 90% s  02  Cough: much better  Sleep: sleeps on pillows at about 30 degrees  SABA use: symbicort 160 2bid / twice albuterol  02 2lpm concentrator at hs and 24/7. rx Plan A = Automatic = Always=    Symbicort 160 Take 1- 2 puffs first thing in am and then another 1-2 puffs about 12 hours later.  Work on inhaler technique:   Plan B = Backup (to supplement plan A, not to replace it) Only use your albuterol inhaler as a rescue medication  Ok to try albuterol (on alternate days) 15 min before an activity that  you know would make you short of breath and see if it makes any difference and if makes none then don't take it after activity unless you can't catch your breath. Make sure you check your oxygen saturation  at your highest level of activity  to be sure it stays over 90% and adjust  02 flow upward to maintain this level if needed but remember to turn it back to previous settings when you stop (to conserve your supply).  Continue 2lpm at bedtime and the rest of the day only if needed    12/04/20  Lap Hernia repair Raymond Watson)    06/01/2021  f/u ov/[place] office/[name] re: bronchiectasis with min obst maint on symbicort 80 one puff  bid  Chief Complaint  Patient presents with   Follow-up    10/24 flu+ Other than the flu no issues since last OV   Dyspnea:  Not limited by breathing from desired activities   Treadmill 5 x per week/ 30 min at / 0 grade and sats upper 90's RA Cough: none  Sleeping: flat bed / two pillow  SABA use: rarely  02: none since April 2022  Covid status: never vax  Still having midline chest /epigastric discomfort when bend over or eats chicken  Rec Plan A = Automatic = Always=    Symbicort 80 Take 1-2 puffs first thing in am and then another 1-2 puffs about 12 hours later.  Work on  inhaler technique:   Plan B = Backup (to supplement plan A, not to replace it) Only use your albuterol inhaler as a rescue medication  See Dr Raymond Watson about the new pains you have having over your lower chest and upper abdomen  that have developed since your surgery    Jan 11th  2023 Underwent combination robotic assisted laparoscopy and L thoracotomy for repair of recurrent paraesophageal hernia, PEG tube placement  at [facility] > returned to work May 8th 2023     12/10/2021  f/u ov/Raymond Watson office/[name] re: bronchiectasis with min obst/  maint on symb 160 2bid   Chief Complaint  Patient presents with   Follow-up    Chest hurting when using inhaler and coughing   Dyspnea:  nothing makes  him sob, just fatigued  Cough: after supper non productive  Sleeping: flat bed 2 pillows / no resp  cc  SABA use: none  02: none Covid status: vax none /  infected x 1 2021  Rec Stop symbicort for now and just use it as needed up to 2 puffs every 12 hours and see if really helps you or not. Start Pantoprazole (protonix) 40 mg   Take  30-60 min before first meal of the day and Pepcid (famotidine)  20 mg after supper until return to office GERD diet/ bed blocks  We will schedule the PFTs in [facility]  01/21/22  wnl     03/11/2022  f/u ov/[place] office/Raymond Watson re: bronchiectasis s/p covid  maint on nothing now Chief Complaint  Patient presents with   Follow-up    Breathing has improved since last ov  Dyspnea:  not limited by breathing  Cough: none  Sleeping: no resp cc flat bed / 2 pillows  SABA use: not using   02: none  Covid status: never vax/ infected x one      No obvious day to day or daytime variability or assoc excess/ purulent sputum or mucus plugs or hemoptysis or cp or chest tightness, subjective wheeze or overt sinus or hb symptoms.   Sleeping  without nocturnal  or early am exacerbation  of respiratory  c/o's or need for noct saba. Also denies any obvious fluctuation of symptoms with weather or environmental changes or other aggravating or alleviating factors except as outlined above   No unusual exposure hx or h/o childhood pna/ asthma or knowledge of premature birth.  Current Allergies, Complete Past Medical History, Past Surgical History, Family History, and Social History were reviewed in [employer] record.  ROS  The following are not active complaints unless bolded Hoarseness, sore throat, dysphagia, dental problems, itching, sneezing,  nasal congestion or discharge of excess mucus or purulent secretions, ear ache,   fever, chills, sweats, unintended wt loss or wt gain, classically pleuritic or exertional cp,  orthopnea pnd or arm/hand  swelling  or leg swelling, presyncope, palpitations, abdominal pain, anorexia, nausea, vomiting, diarrhea  or change in bowel habits or change in bladder habits, change in stools or change in urine, dysuria, hematuria,  rash, arthralgias, visual complaints, headache, numbness, weakness or ataxia or problems with walking or coordination,  change in mood or  memory.        Current Meds  Medication Sig   albuterol (VENTOLIN HFA) 108 (90 Base) MCG/ACT inhaler Inhale into the lungs every 6 (six) hours as needed for wheezing or shortness of breath.   amlodipine-atorvastatin (CADUET) 10-20 MG tablet Take 1 tablet by mouth daily.   famotidine (PEPCID) 20 MG  tablet One after supper   pantoprazole (PROTONIX) 40 MG tablet Take 1 tablet (40 mg total) by mouth daily. Take 30-60 min before first meal of the day   rosuvastatin (CRESTOR) 5 MG tablet Take by mouth.                 Past Medical History:  Diagnosis Date   Hyperlipidemia    Pneumonia        Objective:    Wts  03/11/2022        143  12/10/2021       143  06/01/2021       143  02/08/2021       143 11/13/2020       173  09/21/20 174 lb 12.8 oz (79.3 kg)  08/08/20 174 lb (78.9 kg)  07/24/20 171 lb 6.4 oz (77.7 kg)    Vital signs reviewed  03/11/2022  - Note at rest 02 sats  99% on RA   General appearance:    amb healthy appearing male nad    HEENT : Oropharynx  clear     Nasal turbinates nl    NECK :  without  apparent JVD/ palpable Nodes/TM    LUNGS: no acc muscle use,  Nl contour chest which is clear to A and P bilaterally without cough on insp or exp maneuvers   CV:  RRR  no s3 or murmur or increase in P2, and no edema   ABD:  soft and nontender with nl inspiratory excursion in the supine position. No bruits or organomegaly appreciated   MS:  Nl gait/ ext warm without deformities Or obvious joint restrictions  calf tenderness, cyanosis or clubbing    SKIN: warm and dry without lesions    NEURO:  alert, approp, nl  sensorium with  no motor or cerebellar deficits apparent.          Assessment

## 2022-03-11 NOTE — Patient Instructions (Signed)
Limit your exposure to dust and fumes   Defer to [facility] regarding future vaccinations such as the pneumonia vaccine and RSV.   Pulmonary follow up is as needed

## 2022-10-03 ENCOUNTER — Other Ambulatory Visit: Payer: Self-pay

## 2022-10-03 ENCOUNTER — Encounter (HOSPITAL_COMMUNITY): Payer: Self-pay

## 2022-10-03 ENCOUNTER — Emergency Department (HOSPITAL_COMMUNITY): Payer: BLUE CROSS/BLUE SHIELD

## 2022-10-03 ENCOUNTER — Emergency Department (HOSPITAL_COMMUNITY)
Admission: EM | Admit: 2022-10-03 | Discharge: 2022-10-03 | Disposition: A | Discharge status: Home / Self Care | Payer: BLUE CROSS/BLUE SHIELD | Attending: Emergency Medicine | Admitting: Emergency Medicine

## 2022-10-03 DIAGNOSIS — I10 Essential (primary) hypertension: Secondary | ICD-10-CM | POA: Insufficient documentation

## 2022-10-03 DIAGNOSIS — Z79899 Other long term (current) drug therapy: Secondary | ICD-10-CM | POA: Insufficient documentation

## 2022-10-03 DIAGNOSIS — N201 Calculus of ureter: Secondary | ICD-10-CM | POA: Diagnosis not present

## 2022-10-03 DIAGNOSIS — R1032 Left lower quadrant pain: Secondary | ICD-10-CM | POA: Diagnosis present

## 2022-10-03 LAB — COMPREHENSIVE METABOLIC PANEL
ALT: 21 U/L (ref 0–44)
AST: 22 U/L (ref 15–41)
Albumin: 4.3 g/dL (ref 3.5–5.0)
Alkaline Phosphatase: 68 U/L (ref 38–126)
Anion gap: 9 (ref 5–15)
BUN: 13 mg/dL (ref 6–20)
CO2: 24 mmol/L (ref 22–32)
Calcium: 9.2 mg/dL (ref 8.9–10.3)
Chloride: 104 mmol/L (ref 98–111)
Creatinine, Ser: 1.28 mg/dL — ABNORMAL HIGH (ref 0.61–1.24)
GFR, Estimated: >60 mL/min (ref >60)
Glucose, Bld: 109 mg/dL — ABNORMAL HIGH (ref 70–99)
Potassium: 4.2 mmol/L (ref 3.5–5.1)
Sodium: 137 mmol/L (ref 135–145)
Total Bilirubin: 0.7 mg/dL (ref 0.3–1.2)
Total Protein: 7.5 g/dL (ref 6.5–8.1)

## 2022-10-03 LAB — URINALYSIS, ROUTINE W REFLEX MICROSCOPIC
Bilirubin Urine: NEGATIVE
Glucose, UA: NEGATIVE mg/dL
Ketones, ur: NEGATIVE mg/dL
Leukocytes,Ua: NEGATIVE
Nitrite: NEGATIVE
Protein, ur: NEGATIVE mg/dL
Specific Gravity, Urine: 1.012 (ref 1.005–1.030)
pH: 8 (ref 5.0–8.0)

## 2022-10-03 LAB — CBC
HCT: 45.6 % (ref 39.0–52.0)
Hemoglobin: 14.2 g/dL (ref 13.0–17.0)
MCH: 25.1 pg — ABNORMAL LOW (ref 26.0–34.0)
MCHC: 31.1 g/dL (ref 30.0–36.0)
MCV: 80.7 fL (ref 80.0–100.0)
Platelets: 160 10*3/uL (ref 150–400)
RBC: 5.65 MIL/uL (ref 4.22–5.81)
RDW: 14.2 % (ref 11.5–15.5)
WBC: 8.2 10*3/uL (ref 4.0–10.5)
nRBC: 0 % (ref 0.0–0.2)

## 2022-10-03 LAB — LIPASE, BLOOD: Lipase: 30 U/L (ref 11–51)

## 2022-10-03 MED ORDER — ONDANSETRON HCL 4 MG/2ML IJ SOLN
4.0000 mg | Freq: Once | INTRAMUSCULAR | Status: AC
  Administered 2022-10-03: 4 mg via INTRAVENOUS
  Filled 2022-10-03: qty 2

## 2022-10-03 MED ORDER — TAMSULOSIN HCL 0.4 MG PO CAPS: 0.4000 mg | ORAL_CAPSULE | Freq: Every day | ORAL | 0 refills | Status: AC

## 2022-10-03 MED ORDER — KETOROLAC TROMETHAMINE 15 MG/ML IJ SOLN
15.0000 mg | Freq: Once | INTRAMUSCULAR | Status: AC
  Administered 2022-10-03: 15 mg via INTRAVENOUS
  Filled 2022-10-03: qty 1

## 2022-10-03 MED ORDER — OXYCODONE-ACETAMINOPHEN 5-325 MG PO TABS: 1.0000 | ORAL_TABLET | Freq: Four times a day (QID) | ORAL | 0 refills | Status: DC | PRN

## 2022-10-03 MED ORDER — IOHEXOL 300 MG/ML  SOLN
100.0000 mL | Freq: Once | INTRAMUSCULAR | Status: AC | PRN
  Administered 2022-10-03: 100 mL via INTRAVENOUS

## 2022-10-03 MED ORDER — OXYCODONE HCL 5 MG PO TABS
10.0000 mg | ORAL_TABLET | Freq: Once | ORAL | Status: AC
  Administered 2022-10-03: 10 mg via ORAL
  Filled 2022-10-03: qty 2

## 2022-10-03 MED ORDER — MORPHINE SULFATE (PF) 2 MG/ML IV SOLN
2.0000 mg | Freq: Once | INTRAVENOUS | Status: AC
  Administered 2022-10-03: 2 mg via INTRAVENOUS
  Filled 2022-10-03: qty 1

## 2022-10-03 NOTE — ED Provider Notes (Signed)
[facility] Provider Note   CSN: [id number] Arrival date & time: 10/03/22  1238     History  Chief Complaint  Patient presents with   Abdominal Pain   Nausea    Massi Shute is a 52 y.o. male with history of hyperlipidemia, hypertension, GERD, kidney stones who presents the emergency department complaining of abdominal pain starting at 2 AM this morning.  Patient states that it started in the left lower abdomen and groin, but now feels it is radiating in his left upper abdomen.  Has felt nauseous, no vomiting or diarrhea.  Reports history of abdominal hernia repairs with complications in December 2022 and January 2023.  Patient states this feels similar to the prior abdominal issues that he had had. Hx of kidney stones, previously saw [facility].    Abdominal Pain      Home Medications Prior to Admission medications   Medication Sig Start Date End Date Taking? Authorizing Provider  oxyCODONE-acetaminophen (PERCOCET/ROXICET) 5-325 MG tablet Take 1 tablet by mouth every 6 (six) hours as needed for severe pain. 10/03/22  Yes Lynsi Dooner T, PA-C  tamsulosin (FLOMAX) 0.4 MG CAPS capsule Take 1 capsule (0.4 mg total) by mouth daily. 10/03/22  Yes Tristian Bouska T, PA-C  albuterol (VENTOLIN HFA) 108 (90 Base) MCG/ACT inhaler Inhale into the lungs every 6 (six) hours as needed for wheezing or shortness of breath.    [provider]  amlodipine-atorvastatin (CADUET) 10-20 MG tablet Take 1 tablet by mouth daily.    [provider]  famotidine (PEPCID) 20 MG tablet One after supper 12/10/21   Tanda Rockers, MD  pantoprazole (PROTONIX) 40 MG tablet Take 1 tablet (40 mg total) by mouth daily. Take 30-60 min before first meal of the day 12/10/21   Tanda Rockers, MD  rosuvastatin (CRESTOR) 5 MG tablet Take by mouth. 08/31/21   [provider]      Allergies    Patient has no known allergies.    Review of Systems    Review of Systems  Gastrointestinal:  Positive for abdominal pain.  All other systems reviewed and are negative.   Physical Exam Updated Vital Signs BP 128/81   Pulse 69   Temp 98 F (36.7 C)   Resp 20   Ht '5\' 4"'$  (1.626 m)   Wt 64 kg   SpO2 99%   BMI 24.20 kg/m  Physical Exam Vitals and nursing note reviewed.  Constitutional:      Appearance: Normal appearance.  HENT:     Head: Normocephalic and atraumatic.  Eyes:     Conjunctiva/sclera: Conjunctivae normal.  Cardiovascular:     Rate and Rhythm: Normal rate and regular rhythm.  Pulmonary:     Effort: Pulmonary effort is normal. No respiratory distress.     Breath sounds: Normal breath sounds.  Abdominal:     General: There is no distension.     Palpations: Abdomen is soft.     Tenderness: There is abdominal tenderness in the left lower quadrant.  Skin:    General: Skin is warm and dry.  Neurological:     General: No focal deficit present.     Mental Status: He is alert.     ED Results / Procedures / Treatments   Labs (all labs ordered are listed, but only abnormal results are displayed) Labs Reviewed  COMPREHENSIVE METABOLIC PANEL - Abnormal; Notable for the following components:      Result Value  Glucose, Bld 109 (*)    Creatinine, Ser 1.28 (*)    All other components within normal limits  CBC - Abnormal; Notable for the following components:   MCH 25.1 (*)    All other components within normal limits  URINALYSIS, ROUTINE W REFLEX MICROSCOPIC - Abnormal; Notable for the following components:   Hgb urine dipstick SMALL (*)    Bacteria, UA RARE (*)    All other components within normal limits  LIPASE, BLOOD    EKG None  Radiology CT Abdomen Pelvis W Contrast  Result Date: 10/03/2022 CLINICAL DATA:  Left upper and left lower quadrant abdominal pain to palpation, nausea and vomiting EXAM: CT ABDOMEN AND PELVIS WITH CONTRAST TECHNIQUE: Multidetector CT imaging of the abdomen and pelvis was performed  using the standard protocol following bolus administration of intravenous contrast. RADIATION DOSE REDUCTION: This exam was performed according to the departmental dose-optimization program which includes automated exposure control, adjustment of the mA and/or kV according to patient size and/or use of iterative reconstruction technique. CONTRAST:  166m OMNIPAQUE IOHEXOL 300 MG/ML  SOLN COMPARISON:  04/23/2022 FINDINGS: Lower chest: No acute pleural or parenchymal lung disease. Hepatobiliary: Diffuse hepatic steatosis. No focal liver abnormality. The gallbladder is unremarkable. Pancreas: Unremarkable. No pancreatic ductal dilatation or surrounding inflammatory changes. Spleen: Normal in size without focal abnormality. Adrenals/Urinary Tract: There is an obstructing 6 mm proximal left ureteral calculus reference image 34/2, with mild left-sided hydronephrosis. No right-sided renal calculi. The adrenals and bladder are unremarkable. Stomach/Bowel: No bowel obstruction or ileus. The appendix, if still present, is not well visualized. No bowel wall thickening or inflammatory change. Postsurgical changes from prior hiatal hernia repair. Vascular/Lymphatic: No significant vascular findings are present. No enlarged abdominal or pelvic lymph nodes. Reproductive: Prostate is unremarkable. Other: No free fluid or free intraperitoneal gas. Small fat containing umbilical hernia. No bowel herniation. Musculoskeletal: No acute or destructive bony lesions. Reconstructed images demonstrate no additional findings. IMPRESSION: 1. Obstructing 6 mm proximal left ureteral calculus, with mild left-sided obstructive uropathy. 2. Diffuse hepatic steatosis. 3. Stable postsurgical changes from prior hiatal hernia repair. Electronically Signed   By: MRanda NgoM.D.   On: 10/03/2022 16:04    Procedures Procedures    Medications Ordered in ED Medications  ondansetron (ZOFRAN) injection 4 mg (4 mg Intravenous Given 10/03/22 1349)   iohexol (OMNIPAQUE) 300 MG/ML solution 100 mL (100 mLs Intravenous Contrast Given 10/03/22 1535)  ketorolac (TORADOL) 15 MG/ML injection 15 mg (15 mg Intravenous Given 10/03/22 1700)  morphine (PF) 2 MG/ML injection 2 mg (2 mg Intravenous Given 10/03/22 1702)  oxyCODONE (Oxy IR/ROXICODONE) immediate release tablet 10 mg (10 mg Oral Given 10/03/22 1842)    ED Course/ Medical Decision Making/ A&P                             Medical Decision Making Amount and/or Complexity of Data Reviewed Labs: ordered.  Risk Prescription drug management.  This patient is a 52y.o. male  who presents to the ED for concern of left sided abdominal pain.   Differential diagnoses prior to evaluation: The emergent differential diagnosis includes, but is not limited to,  AAA, mesenteric ischemia, appendicitis, diverticulitis, DKA, gastroenteritis, nephrolithiasis, pancreatitis, constipation, UTI, bowel obstruction, biliary disease, IBD, PUD, hepatitis, testicular torsion. This is not an exhaustive differential.   Past Medical History / Co-morbidities: hyperlipidemia, hypertension, GERD, kidney stones  Additional history: Chart reviewed. Pertinent results include: hiatal hernia, with incarceration  and volvulus in January 2023,  s/p gastropexy in December 2022  Physical Exam: Physical exam performed. The pertinent findings include: Vital signs, in no acute distress.  Abdomen soft, tender in the left lower quadrant.  No rebound.  Mild guarding.  Lab Tests/Imaging studies: I personally interpreted labs/imaging and the pertinent results include: No leukocytosis, normal hemoglobin.  CMP with mildly elevated creatinine, normal GFR.  Urinalysis with small hemoglobin, negative for infection.  Normal lipase.  CT abdomen pelvis shows 6 mm obstructing left ureteral stone with mild uropathy. I agree with the radiologist interpretation.  Medications: I ordered medication including pain medications and zofran.  I have reviewed  the patients home medicines and have made adjustments as needed.  Consultations obtained: I consulted with on-call urologist Dr. Milford Cage.  He recommends sending patient home on Flomax and having him follow-up in the office tomorrow.  Patient may require stent.   Disposition: After consideration of the diagnostic results and the patients response to treatment, I feel that emergency department workup does not suggest an emergent condition requiring admission or immediate intervention beyond what has been performed at this time. The plan is: Discharged home with pain medications and Flomax, with urology follow-up tomorrow. The patient is safe for discharge and has been instructed to return immediately for worsening symptoms, change in symptoms or any other concerns.  Final Clinical Impression(s) / ED Diagnoses Final diagnoses:  Left ureteral stone    Rx / DC Orders ED Discharge Orders          Ordered    oxyCODONE-acetaminophen (PERCOCET/ROXICET) 5-325 MG tablet  Every 6 hours PRN        10/03/22 1947    tamsulosin (FLOMAX) 0.4 MG CAPS capsule  Daily        10/03/22 1947           Portions of this report may have been transcribed using voice recognition software. Every effort was made to ensure accuracy; however, inadvertent computerized transcription errors may be present.    Estill Cotta 10/03/22 2112    Fredia Sorrow, MD 10/03/22 2207

## 2022-10-03 NOTE — ED Provider Triage Note (Signed)
Emergency Medicine Provider Triage Evaluation Note  Raymond Watson , a 52 y.o. male  was evaluated in triage.  Pt complains of left upper and left lower quadrant abdominal tenderness to palpation.  States feels similar to when he had postop complications in the past.  States his "heart and stomach very together."  Pain began at 2:00 this morning.  Rates it as severe.  Had a normal bowel movement today.  Endorses nausea and vomiting.  Review of Systems  Positive: As above Negative: As above  Physical Exam  BP (!) 146/105 (BP Location: Right Arm)   Pulse 86   Temp 99 F (37.2 C) (Oral)   Resp 18   Ht '5\' 4"'$  (1.626 m)   Wt 64 kg   SpO2 99%   BMI 24.20 kg/m  Gen:   Awake, moderate distress   Resp:  Normal effort  MSK:   Moves extremities without difficulty  Other:    Medical Decision Making  Medically screening exam initiated at 12:59 PM.  Appropriate orders placed.  Raymond Watson was informed that the remainder of the evaluation will be completed by another provider, this initial triage assessment does not replace that evaluation, and the importance of remaining in the ED until their evaluation is complete.     Roylene Reason, [place] 10/03/22 1300

## 2022-10-03 NOTE — ED Notes (Signed)
Pt requesting pain and nausea medications. Cristie Hem, [place] notified

## 2022-10-03 NOTE — ED Triage Notes (Signed)
Lower left abdominal pain started at 2am. Pain is radiating into upper left abdomen area now. Nausea, no vomiting, no diarrhea. Hx of hernia repairs with complications [id number] and [id number]. Pt states this feel similar to his prior abdominal issues.

## 2022-10-03 NOTE — Discharge Instructions (Addendum)
You were seen in the ER for abdominal pain.  As we discussed, you have a 6 mm kidney stone on the left which I suspect is what is causing your symptoms.  I talked to the urologist on-call Dr. Milford Cage, with [facility].  He recommended that I start you on Flomax.  Please call their office first thing tomorrow morning, they open at 8 AM.  It is possible they may want to do a procedure and place a stent.  So he recommended if at all possible do not eat before your appointment.  I am prescribing you the Flomax as well as pain medication.

## 2022-10-04 ENCOUNTER — Encounter (HOSPITAL_BASED_OUTPATIENT_CLINIC_OR_DEPARTMENT_OTHER): Payer: Self-pay | Admitting: Urology

## 2022-10-04 ENCOUNTER — Other Ambulatory Visit: Payer: Self-pay | Admitting: Urology

## 2022-10-04 NOTE — Progress Notes (Signed)
Pre-op phone call complete. Procedure date and arrival time confirmed. Patient medical history, allergies, and medications verified. Patient can have clear liquids until 0430 and NPO of food at midnight. Patient advised not to have NSAIDs within 48 hours before the procedure and Aspirin or Aspirin products within 72 hours prior. Patient denies any recent history of chest pain or COVID. Patient states that he no longer uses home oxygen. Driver secured.

## 2022-10-06 NOTE — H&P (Signed)
H&P  Chief Complaint: Lt ureteral stone  History of Present Illness: 52 yo male presenting for ESL for primary mgmt of a symptomatic Lt upper ureteral stone. SSD 11 cm, HU ~ 1000.  Past Medical History:  Diagnosis Date   GERD (gastroesophageal reflux disease)    Headache    History of kidney stones    Hyperlipidemia    Hypertension    no meds   Pneumonia 04/2020   due to Covid   Post-operative nausea and vomiting     Past Surgical History:  Procedure Laterality Date   ESOPHAGOGASTRODUODENOSCOPY (EGD) WITH PROPOFOL N/A 07/30/2021   Procedure: ESOPHAGOGASTRODUODENOSCOPY (EGD) WITH PROPOFOL;  Surgeon: Ladene Artist, MD;  Location: [facility];  Service: Endoscopy;  Laterality: N/A;   HERNIA REPAIR     age [age]   UPPER GI ENDOSCOPY N/A 07/24/2021   Procedure: UPPER GI ENDOSCOPY;  Surgeon: Johnathan Hausen, MD;  Location: [facility];  Service: General;  Laterality: N/A;   XI ROBOTIC ASSISTED HIATAL HERNIA REPAIR N/A 12/04/2020   Procedure: LAPAROSCOPIC  HIATAL HERNIA REPAIR WITH FUNDOPLICATION;  Surgeon: Johnathan Hausen, MD;  Location: [facility];  Service: General;  Laterality: N/A;   XI ROBOTIC ASSISTED HIATAL HERNIA REPAIR N/A 07/24/2021   Procedure: XI ROBOTIC REDO ASSISTED TAKE DOWN INCARCERATED HERNIA WITH GASTROPEXY;  Surgeon: Johnathan Hausen, MD;  Location: [facility];  Service: General;  Laterality: N/A;    Home Medications:  Allergies as of 10/06/2022   No Known Allergies      Medication List      Notice   Cannot display discharge medications because the patient has not yet been admitted.     Allergies: No Known Allergies  Family History  Problem Relation Age of Onset   Heart failure Mother    COPD Mother     Social History:  reports that he has never smoked. He has never used smokeless tobacco. He reports that he does not currently use drugs. He reports that he does not drink alcohol.  ROS: A complete review of systems was performed.  All systems are negative except  for pertinent findings as noted.  Physical Exam:  Vital signs in last 24 hours: There were no vitals taken for this visit. Constitutional:  Alert and oriented, No acute distress Cardiovascular: Regular rate  Respiratory: Normal respiratory effort Neurologic: Grossly intact, no focal deficits Psychiatric: Normal mood and affect  I have reviewed prior pt notes  I have reviewed urinalysis results  I have independently reviewed prior imaging    Impression/Assessment:  7 mm Lt UU stone  Plan:  ESL as first part of a possible staged procedure.

## 2022-10-07 ENCOUNTER — Ambulatory Visit (HOSPITAL_COMMUNITY): Payer: BLUE CROSS/BLUE SHIELD

## 2022-10-07 ENCOUNTER — Ambulatory Visit (HOSPITAL_BASED_OUTPATIENT_CLINIC_OR_DEPARTMENT_OTHER)
Admission: RE | Admit: 2022-10-07 | Discharge: 2022-10-07 | Disposition: A | Discharge status: Home / Self Care | Payer: BLUE CROSS/BLUE SHIELD | Source: Ambulatory Visit | Attending: Urology | Admitting: Urology

## 2022-10-07 ENCOUNTER — Encounter (HOSPITAL_BASED_OUTPATIENT_CLINIC_OR_DEPARTMENT_OTHER)
Admission: RE | Disposition: A | Discharge status: Home / Self Care | Payer: Self-pay | Source: Ambulatory Visit | Attending: Urology

## 2022-10-07 ENCOUNTER — Encounter (HOSPITAL_BASED_OUTPATIENT_CLINIC_OR_DEPARTMENT_OTHER): Payer: Self-pay | Admitting: Urology

## 2022-10-07 DIAGNOSIS — E785 Hyperlipidemia, unspecified: Secondary | ICD-10-CM | POA: Insufficient documentation

## 2022-10-07 DIAGNOSIS — N201 Calculus of ureter: Secondary | ICD-10-CM | POA: Insufficient documentation

## 2022-10-07 DIAGNOSIS — Z87442 Personal history of urinary calculi: Secondary | ICD-10-CM | POA: Insufficient documentation

## 2022-10-07 DIAGNOSIS — K219 Gastro-esophageal reflux disease without esophagitis: Secondary | ICD-10-CM | POA: Insufficient documentation

## 2022-10-07 DIAGNOSIS — I10 Essential (primary) hypertension: Secondary | ICD-10-CM | POA: Insufficient documentation

## 2022-10-07 HISTORY — PX: EXTRACORPOREAL SHOCK WAVE LITHOTRIPSY: SHX1557

## 2022-10-07 SURGERY — LITHOTRIPSY, ESWL
Anesthesia: LOCAL | Laterality: Left

## 2022-10-07 MED ORDER — DIAZEPAM 5 MG PO TABS
ORAL_TABLET | ORAL | Status: AC
  Filled 2022-10-07: qty 2

## 2022-10-07 MED ORDER — DIPHENHYDRAMINE HCL 25 MG PO CAPS
25.0000 mg | ORAL_CAPSULE | ORAL | Status: AC
  Administered 2022-10-07: 25 mg via ORAL

## 2022-10-07 MED ORDER — SODIUM CHLORIDE 0.9 % IV SOLN: INTRAVENOUS | Status: DC

## 2022-10-07 MED ORDER — KETOROLAC TROMETHAMINE 30 MG/ML IJ SOLN
30.0000 mg | Freq: Once | INTRAMUSCULAR | Status: AC
  Administered 2022-10-07: 30 mg via INTRAVENOUS

## 2022-10-07 MED ORDER — KETOROLAC TROMETHAMINE 30 MG/ML IJ SOLN
INTRAMUSCULAR | Status: AC
  Filled 2022-10-07: qty 1

## 2022-10-07 MED ORDER — OXYCODONE-ACETAMINOPHEN 5-325 MG PO TABS: 1.0000 | ORAL_TABLET | Freq: Four times a day (QID) | ORAL | 0 refills | Status: AC | PRN

## 2022-10-07 MED ORDER — DIPHENHYDRAMINE HCL 25 MG PO CAPS
ORAL_CAPSULE | ORAL | Status: AC
  Filled 2022-10-07: qty 1

## 2022-10-07 MED ORDER — CIPROFLOXACIN HCL 500 MG PO TABS
500.0000 mg | ORAL_TABLET | ORAL | Status: AC
  Administered 2022-10-07: 500 mg via ORAL

## 2022-10-07 MED ORDER — DIAZEPAM 5 MG PO TABS
10.0000 mg | ORAL_TABLET | ORAL | Status: AC
  Administered 2022-10-07: 10 mg via ORAL

## 2022-10-07 MED ORDER — CIPROFLOXACIN HCL 500 MG PO TABS
ORAL_TABLET | ORAL | Status: AC
  Filled 2022-10-07: qty 1

## 2022-10-07 NOTE — Discharge Instructions (Signed)
See [facility] discharge instructions in chart.  

## 2022-10-07 NOTE — Op Note (Signed)
See [facility] OP note scanned into chart. 

## 2022-10-08 ENCOUNTER — Encounter (HOSPITAL_BASED_OUTPATIENT_CLINIC_OR_DEPARTMENT_OTHER): Payer: Self-pay | Admitting: Urology

## 2022-12-26 IMAGING — DX DG CHEST 2V
2 series · 2 of 2 positions shown · non-contrast
Comparison: 07/24/2020

CLINICAL DATA: 50-year-old male with a history of chest pain after
hiatal hernia surgery

EXAM:
CHEST - 2 VIEW

[chest pa]
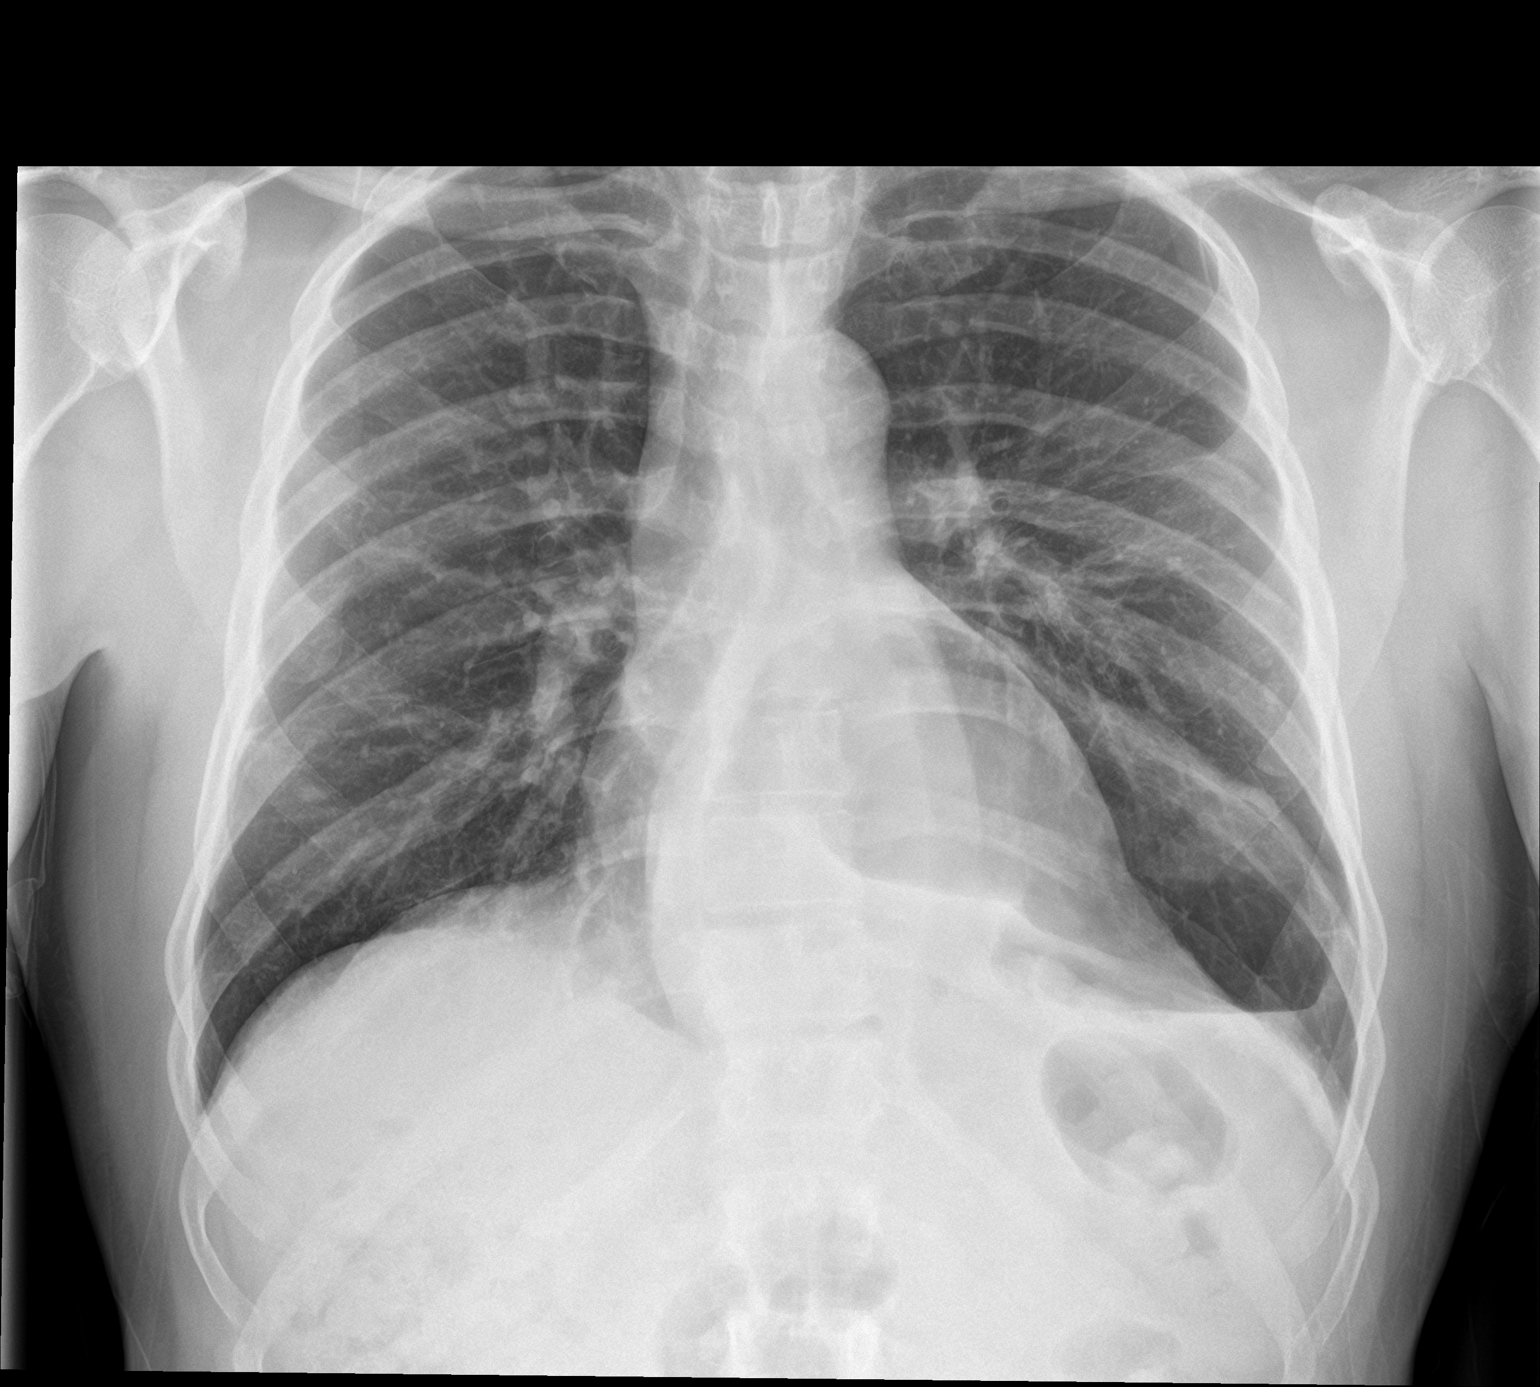

[chest lat]
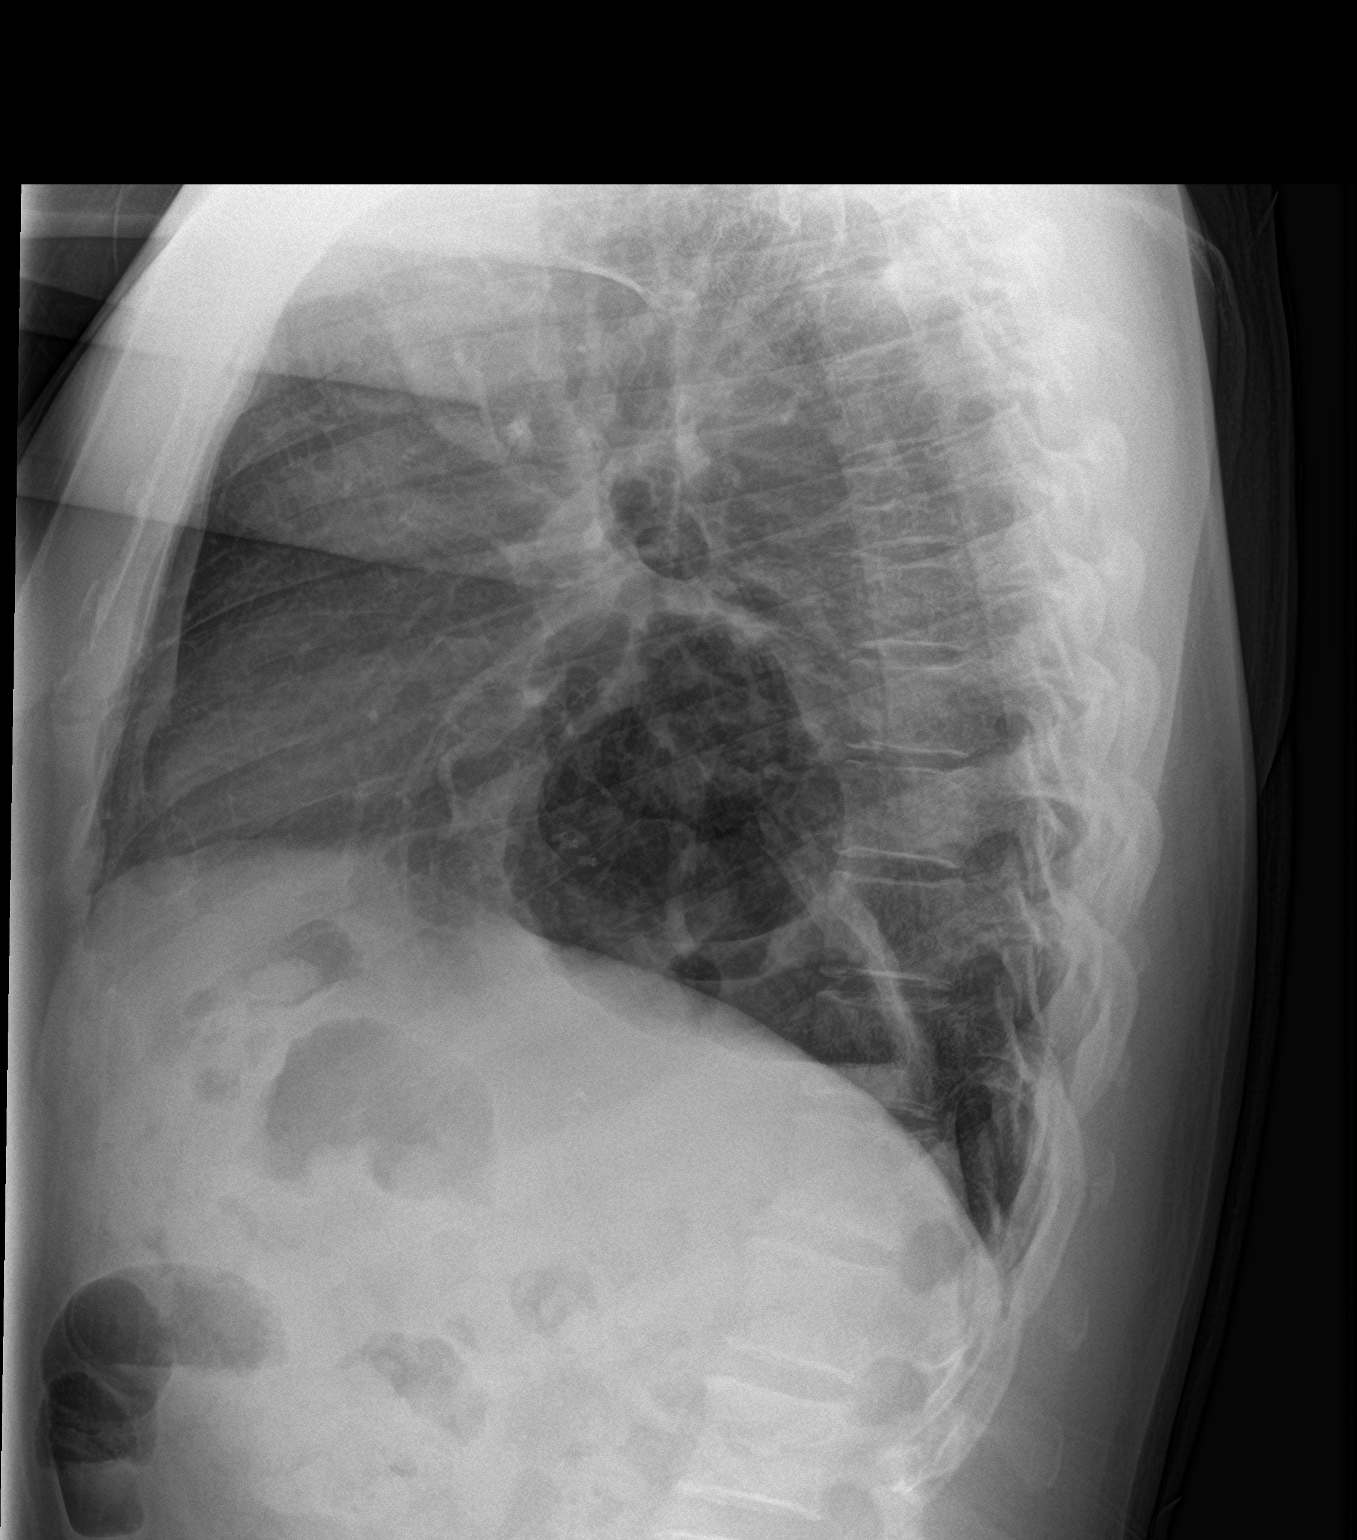

[2 of 2 positions shown; findings below may reference images not displayed]

FINDINGS: Cardiomediastinal silhouette unchanged in size and contour. No
evidence of central vascular congestion. No interlobular septal
thickening.

Double density projecting over the lower mediastinum, unchanged from
the prior.

No pneumothorax or pleural effusion. Coarsened interstitial
markings, with no confluent airspace disease.

No acute displaced fracture. Degenerative changes of the spine.
IMPRESSION: Negative for acute cardiopulmonary disease.

Hiatal hernia, similar to the comparison chest x-ray

## 2022-12-28 ENCOUNTER — Other Ambulatory Visit: Payer: Self-pay | Admitting: Internal Medicine

## 2023-01-12 IMAGING — RF DG UGI W/ HIGH DENSITY W/O KUB
11 series · 14 of 24 positions shown · non-contrast
Comparison: 12/27/2020 high-resolution chest CT. 11/09/2020 upper
GI.

CLINICAL DATA: History of robotic hiatal hernia repair with
fundoplication 12/04/2020, with recurrent hiatal hernia on prior
imaging. Patient reports left upper quadrant pain.

EXAM:
UPPER GI SERIES WITH KUB
TECHNIQUE: After obtaining a scout radiograph a routine upper GI series was
performed using thin and high density barium.
FLUOROSCOPY TIME:  Fluoroscopy Time:  5 minutes 24 seconds
Radiation Exposure Index (if provided by the fluoroscopic device):
46 mGy
Number of Acquired Spot Images: 12

[Series 1: one shot · 0.14mm/px · 1 of 1 slices shown (1 of 4)]
[im 1/1]
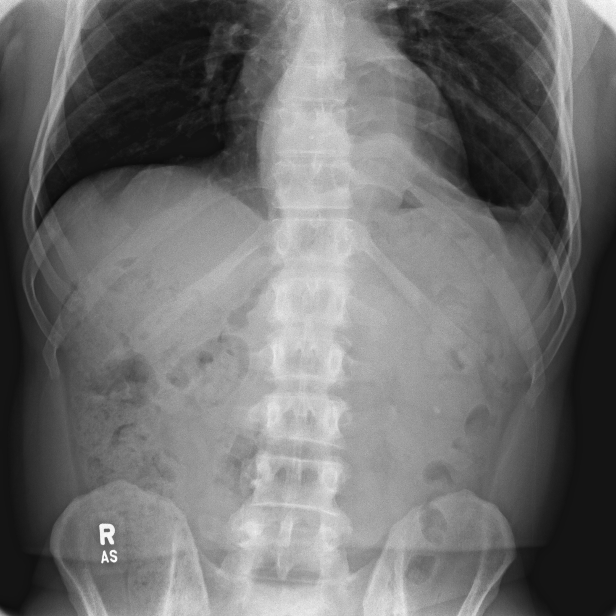

[Series 2: sequence · 1 of 45 frames shown (1 of 7)]
[frame 37/45]
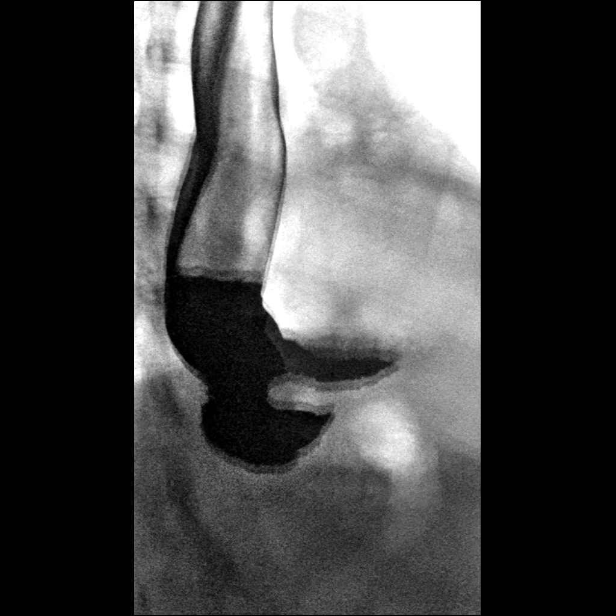

[Series 3: one shot · 0.15mm/px · 3 of 8 slices shown (2 of 4)]
[im 3/8]
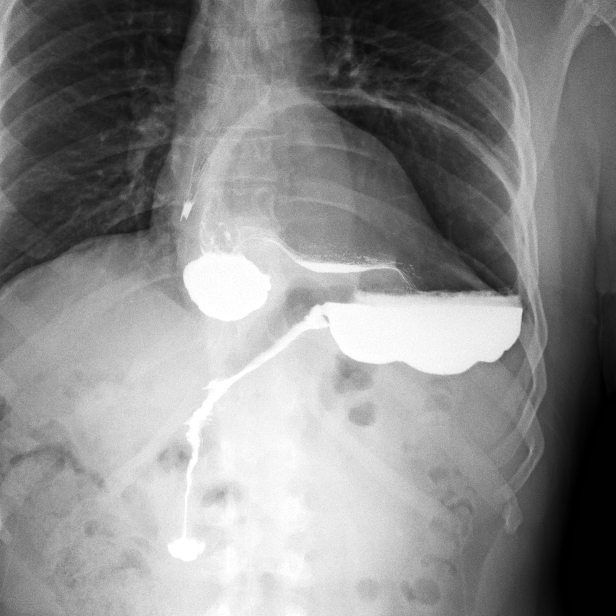
[im 6/8]
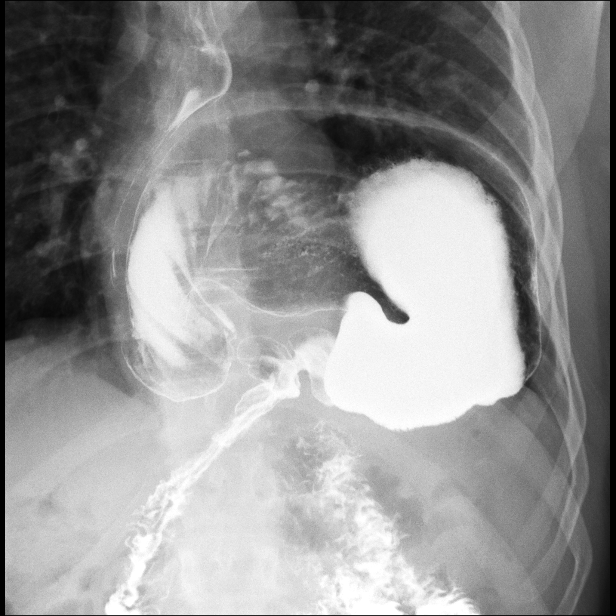
[im 8/8]
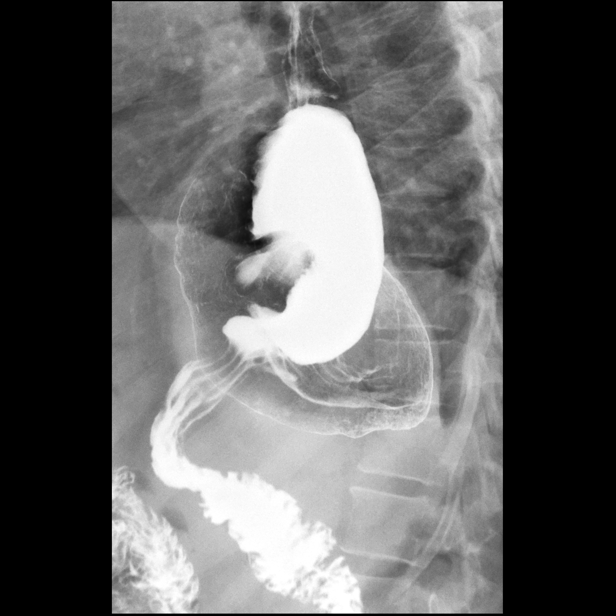

[Series 4: sequence · 1 of 44 frames shown (2 of 7)]
[frame 38/44]
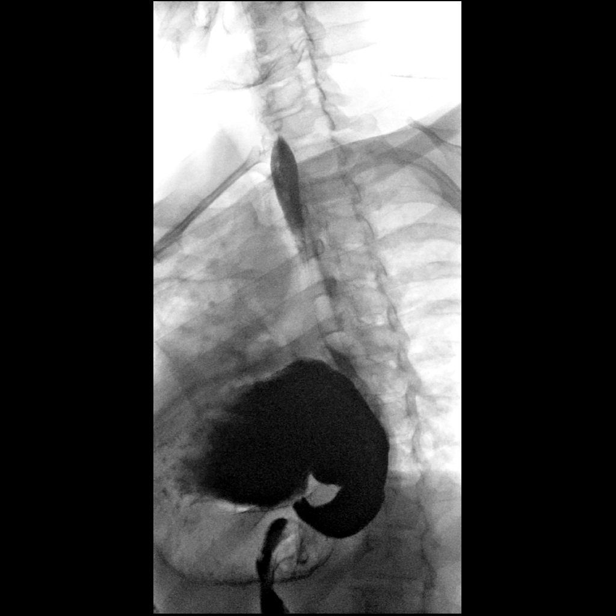

[Series 5: sequence · 1 of 92 frames shown (3 of 7)]
[frame 79/92]
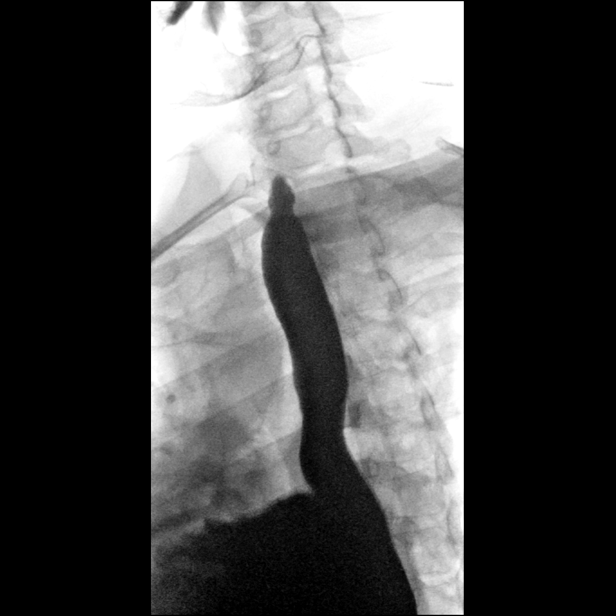

[Series 6: one shot · 0.16mm/px · 1 of 2 slices shown (3 of 4)]
[im 1/2]
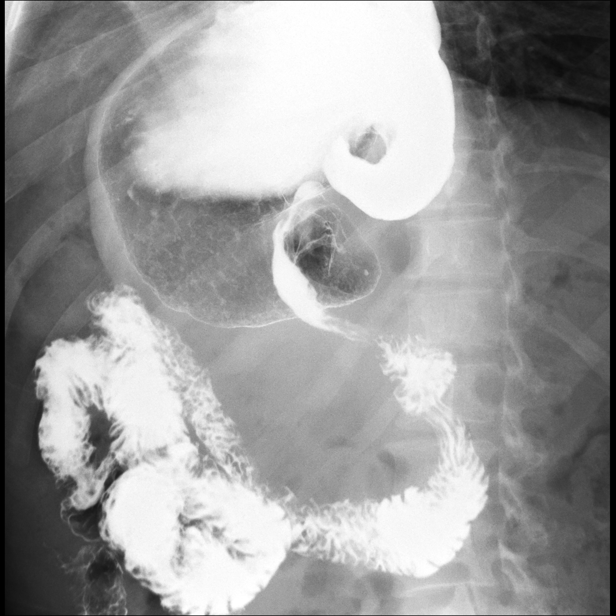

[Series 7: sequence · 1 of 45 frames shown (4 of 7)]
[frame 23/45]
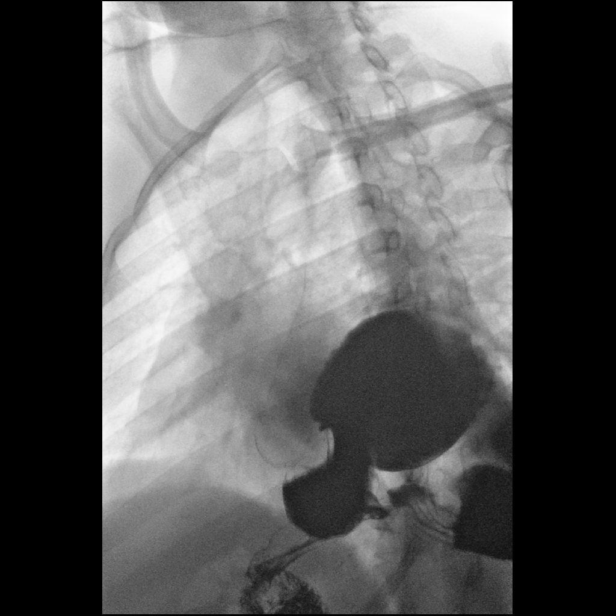

[Series 9: sequence · 2 of 91 frames shown (5 of 7)]
[frame 14/91]
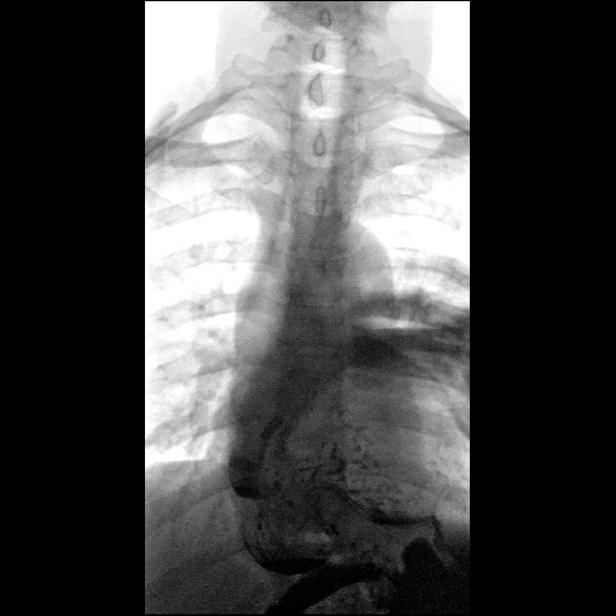
[frame 78/91]
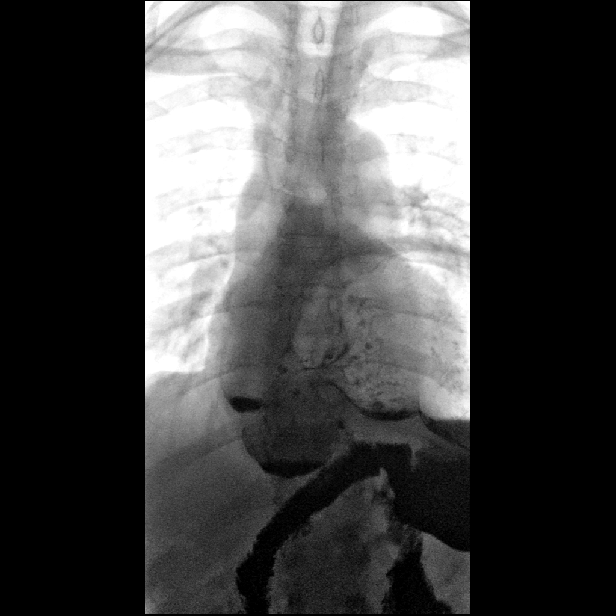

[Series 10: sequence · 1 of 144 frames shown (6 of 7)]
[frame 22/144]
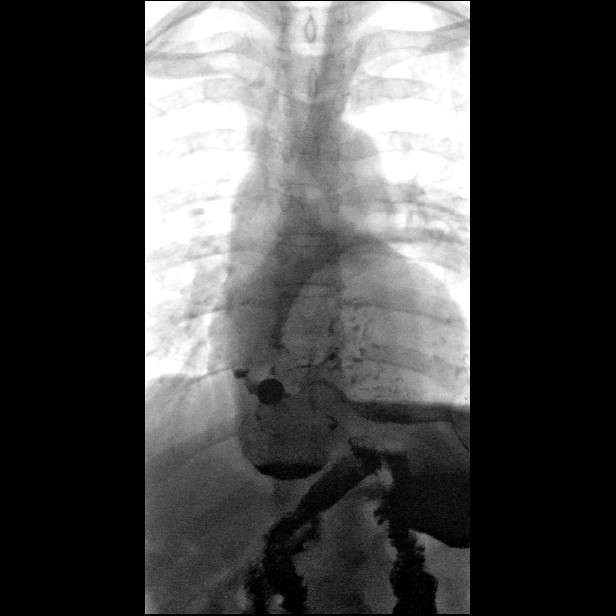

[Series 11: sequence · 1 of 36 frames shown (7 of 7)]
[frame 18/36]
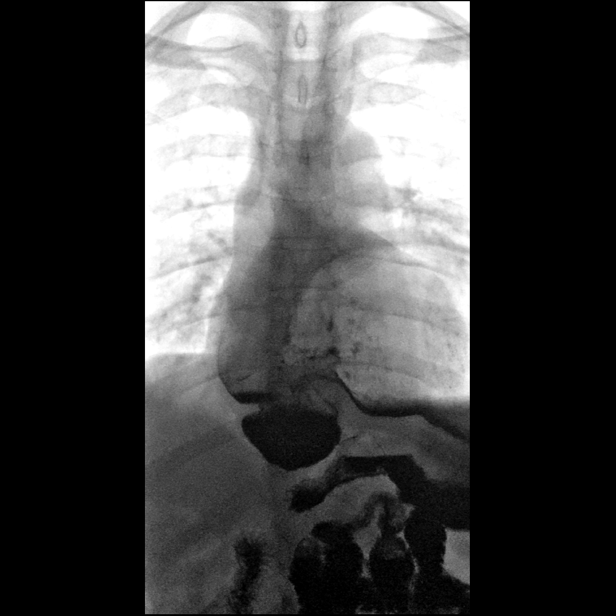

[Series 12: one shot · 0.15mm/px · 1 of 1 slices shown (4 of 4)]
[im 1/1]
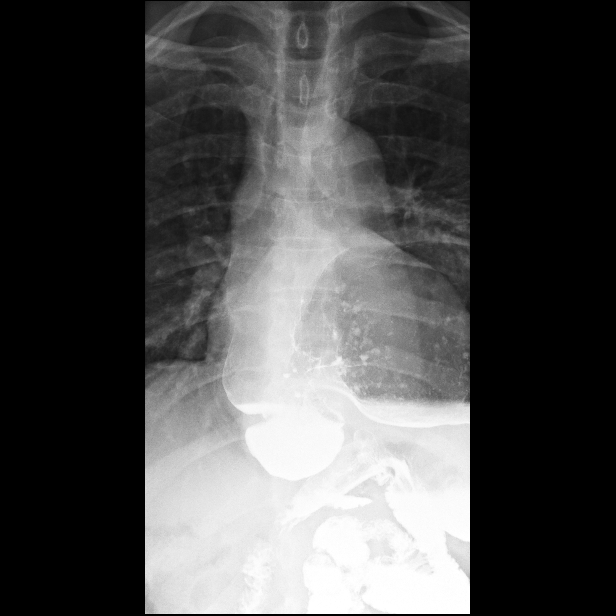

[14 of 24 positions shown; findings below may reference images not displayed]

FINDINGS: Scout radiograph demonstrates no dilated small bowel loops. Moderate
colonic stool. No evidence of pneumatosis or pneumoperitoneum. Lower
left renal 3 mm stone.

There is a large recurrent hiatal hernia with nearly entirely
intrathoracic stomach, with large paraesophageal component. There
appears to be partial twisting of the stomach, favor organoaxial.
Normal gastric emptying, with no evidence of acute gastric volvulus.
Patulous thoracic esophagus. Normal esophageal mucosa, with no
evidence of reflux esophagitis. No gastroesophageal reflux elicited
despite provocative maneuvers including water siphon test. Moderate
esophageal dysmotility, characterized by intermittent weakening of
primary peristalsis throughout the thoracic esophagus. No evidence
of esophageal mass, ulcer or stricture. The swallowed 13 mm barium
tablet traversed the esophagus into the stomach without significant
delay. No gastric fold thickening, filling defects, ulcers or
significant erosions. Duodenal bulb and C sweep are unremarkable,
with no duodenal fold thickening, filling defects, ulcers or
strictures. Duodenal jejunal junction is normal in position to the
left of the spine. Visualized proximal jejunal loops are normal
caliber and without fold thickening.
IMPRESSION: 1. Large recurrent hiatal hernia with nearly entirely intrathoracic
stomach, with large paraesophageal component. Apparent partial
twisting of the stomach, favor organoaxial, with no evidence of
acute gastric volvulus.
2. Patulous thoracic esophagus with moderate esophageal dysmotility.
No evidence of gastroesophageal reflux.
3. No evidence of masses, strictures or ulcers.
